# Patient Record
Sex: Female | Born: 1980 | Race: White | Hispanic: No | Marital: Married | Smoking: Never smoker
Health system: Southern US, Community
[De-identification: ages and names within clinical notes are randomized; demographics above are authoritative.]

## PROBLEM LIST (undated history)

## (undated) DIAGNOSIS — Z789 Other specified health status: Secondary | ICD-10-CM

## (undated) DIAGNOSIS — O4290 Premature rupture of membranes, unspecified as to length of time between rupture and onset of labor, unspecified weeks of gestation: Secondary | ICD-10-CM

## (undated) HISTORY — PX: NO PAST SURGERIES: SHX2092

## (undated) SURGERY — ERCP, WITH INTERVENTION IF INDICATED
Anesthesia: Monitor Anesthesia Care | Laterality: Left

---

## 2014-06-14 ENCOUNTER — Encounter (HOSPITAL_COMMUNITY): Payer: Self-pay | Admitting: Emergency Medicine

## 2014-06-14 ENCOUNTER — Emergency Department (HOSPITAL_COMMUNITY)
Admission: EM | Admit: 2014-06-14 | Discharge: 2014-06-14 | Disposition: A | Discharge status: Home / Self Care | Payer: BC Managed Care – PPO | Source: Home / Self Care | Attending: Emergency Medicine | Admitting: Emergency Medicine

## 2014-06-14 DIAGNOSIS — T63621A Toxic effect of contact with other jellyfish, accidental (unintentional), initial encounter: Secondary | ICD-10-CM

## 2014-06-14 DIAGNOSIS — T6391XA Toxic effect of contact with unspecified venomous animal, accidental (unintentional), initial encounter: Secondary | ICD-10-CM

## 2014-06-14 MED ORDER — PREDNISONE 20 MG PO TABS: 20.0000 mg | ORAL_TABLET | Freq: Two times a day (BID) | ORAL | Status: DC

## 2014-06-14 MED ORDER — HYDROCODONE-ACETAMINOPHEN 5-325 MG PO TABS: ORAL_TABLET | ORAL | Status: DC

## 2014-06-14 MED ORDER — HYDROXYZINE HCL 25 MG PO TABS: 25.0000 mg | ORAL_TABLET | Freq: Three times a day (TID) | ORAL | Status: DC | PRN

## 2014-06-14 MED ORDER — TRIAMCINOLONE ACETONIDE 0.1 % EX CREA: 1.0000 "application " | TOPICAL_CREAM | Freq: Three times a day (TID) | CUTANEOUS | Status: DC

## 2014-06-14 NOTE — Discharge Instructions (Signed)
Coelenterate Sting You have received a coelenterate sting. This class of animals contains the jelly-fish, coral, man-of-war, and anemones. Animals in this class cause injury on contact. They cause injury by firing of thousands of nematocysts (thread-like stingers) into the skin. Tentacles have the ability to fire their nematocysts long after the animal is dead. It is not safe to step on or touch a dead coelenterate lying on the beach without skin protection.These lesions (sores) start out as small, raised, red papules (small, raised bumps on skin) that are painful and itch. These papules may form blisters, become hemorrhagic (bloody), pustules (containing pus), and lose their skin. Total body reactions may occur. These reactions include headache, nausea (feeling sick to your stomach), muscle pain and spasm, perspiration, changes in pulse rates, lacrimation (excessive tears from eyes), and chest pain. The man-of-war, box jelly fish, and sea wasp can all cause human death. TREATMENT Remove tentacles while wearing rubber gloves or by using forceps. Tentacles may also be scraped off with (a credit card or similar object works well for scraping). Analgesics (pain relievers) should be given right away, even for minor stings.  Only take over-the-counter or prescription medicines for pain, discomfort, or fever as directed by your caregiver.  Place ice directly on the sting for comfort. Be careful not to leave ice sit for too long. This could cause damage from frostbite.  Vinegar will stop the stinging of the jelly fish. This is used in [place] for the [employer].  A mixture of half baking soda and half water will help ease the stinging of sea nettle.  Use ocean water wash for the [place] man-of-war sting. SEEK IMMEDIATE MEDICAL CARE (CALL 911) FOR ANY TYPE OF GENERALIZED BODY REACTION. Serious reactions may require advanced cardiovascular life support. This life support is available only through an  emergency department or under advanced medical care. MAKE SURE YOU:   Understand and follow these instructions.  Monitor your condition.  Get help right away if you are not doing well or getting worse. Document Released: 09/12/2000 Document Revised: 12/08/2011 Document Reviewed: 09/07/2008 [facility] Patient Information 2015 [place], [place]. This information is not intended to replace advice given to you by your health care provider. Make sure you discuss any questions you have with your health care provider.

## 2014-06-14 NOTE — ED Notes (Signed)
States she was stung by something while she was at beach last week. Did not see what stung her, bur presumed it was a jelly fish of some type. Skin injury noted on bilateral thighs. Has been treating w vinegar , benadryl, ice packs

## 2014-06-14 NOTE — ED Provider Notes (Signed)
  Chief Complaint    Skin Problem   History of Present Illness      Tricia Skinner is a 33 year old female who was swimming at [facility] a week ago when she was stung by something underneath water. She thinks it may have been a jellyfish, but she's not sure. She put vinegar on the wound initially. It stung and burned for a few hours, then got better, however a few days later again began to sting and burn again, and she has developed some red marks on her legs. She denies any systemic symptoms such as fever, chills, headache, paresthesias, nausea, or vomiting. She's had no difficulty breathing, or swelling of the lips, tongue, or throat.  Review of Systems   Other than as noted above, the patient denies any of the following symptoms: Systemic:  No fever or chills. ENT:  No nasal congestion, rhinorrhea, sore throat, swelling of lips, tongue or throat. Resp:  No cough, wheezing, or shortness of breath.  PMFSH    Past medical history, family history, social history, meds, and allergies were reviewed.   Physical Exam     Vital signs:  BP 134/94  Pulse 56  Temp(Src) 98.9 F (37.2 C) (Oral)  Resp 16  SpO2 99%  LMP 05/21/2014 Gen:  Alert, oriented, in no distress. ENT:  Pharynx clear, no intraoral lesions, moist mucous membranes. Lungs:  Clear to auscultation. Skin:  She has streaks of erythematous maculopapules on both thighs. There was no ulceration, no evidence of infection.    Assessment    The encounter diagnosis was Jellyfish sting, accidental or unintentional, initial encounter.  Plan     1.  Meds:  The following meds were prescribed:   New Prescriptions   HYDROCODONE-ACETAMINOPHEN (NORCO/VICODIN) 5-325 MG PER TABLET    1 to 2 tabs every 4 to 6 hours as needed for pain.   HYDROXYZINE (ATARAX/VISTARIL) 25 MG TABLET    Take 1 tablet (25 mg total) by mouth every 8 (eight) hours as needed for itching.   PREDNISONE (DELTASONE) 20 MG TABLET    Take 1 tablet (20 mg total) by  mouth 2 (two) times daily.   TRIAMCINOLONE CREAM (KENALOG) 0.1 %    Apply 1 application topically 3 (three) times daily.    2.  Patient Education/Counseling:  The patient was given appropriate handouts, self care instructions, and instructed in symptomatic relief.    3.  Follow up:  The patient was told to follow up here if no better in 3 to 4 days, or sooner if becoming worse in any way, and given some red flag symptoms such as worsening rash, fever, or difficulty breathing which would prompt immediate return.  Follow up here if necessary.      Reuben Likes, MD 06/14/14 [id number]

## 2014-09-29 NOTE — L&D Delivery Note (Cosign Needed)
Delivery Note At 6:11 PM a viable female was delivered via Vaginal, Spontaneous Delivery (Presentation: ; Occiput Anterior).  APGAR: 9, 9; weight  .   Placenta status: trailing membrane, handoff made to patient's private attending for further management and for laceration repair.  Cord: 3 vessels with the following complications: None.  Cord pH: not obtained  Anesthesia: None  Episiotomy: None Est. Blood Loss (mL):  350 ml  Mom to postpartum.  Baby to Couplet care / Skin to Skin.  Tricia Skinner 05/17/2015, 7:10 PM

## 2014-11-03 LAB — OB RESULTS CONSOLE RUBELLA ANTIBODY, IGM: RUBELLA: IMMUNE

## 2014-11-03 LAB — OB RESULTS CONSOLE GC/CHLAMYDIA
CHLAMYDIA, DNA PROBE: NEGATIVE
Gonorrhea: NEGATIVE

## 2014-11-03 LAB — OB RESULTS CONSOLE ABO/RH: RH Type: POSITIVE

## 2014-11-03 LAB — OB RESULTS CONSOLE ANTIBODY SCREEN: Antibody Screen: NEGATIVE

## 2014-11-03 LAB — OB RESULTS CONSOLE HEPATITIS B SURFACE ANTIGEN: HEP B S AG: NEGATIVE

## 2014-11-03 LAB — OB RESULTS CONSOLE RPR: RPR: NONREACTIVE

## 2014-11-03 LAB — OB RESULTS CONSOLE HIV ANTIBODY (ROUTINE TESTING): HIV: NONREACTIVE

## 2015-01-26 ENCOUNTER — Encounter (HOSPITAL_COMMUNITY): Payer: Self-pay | Admitting: Obstetrics and Gynecology

## 2015-01-26 ENCOUNTER — Other Ambulatory Visit (HOSPITAL_COMMUNITY): Payer: Self-pay | Admitting: Obstetrics and Gynecology

## 2015-01-26 ENCOUNTER — Encounter (HOSPITAL_COMMUNITY): Payer: Self-pay

## 2015-01-26 DIAGNOSIS — O358XX0 Maternal care for other (suspected) fetal abnormality and damage, not applicable or unspecified: Secondary | ICD-10-CM

## 2015-01-26 DIAGNOSIS — O35EXX0 Maternal care for other (suspected) fetal abnormality and damage, fetal genitourinary anomalies, not applicable or unspecified: Secondary | ICD-10-CM

## 2015-01-30 ENCOUNTER — Other Ambulatory Visit (HOSPITAL_COMMUNITY): Payer: Self-pay | Admitting: Maternal and Fetal Medicine

## 2015-01-30 ENCOUNTER — Encounter (HOSPITAL_COMMUNITY): Payer: Self-pay

## 2015-01-30 ENCOUNTER — Ambulatory Visit (HOSPITAL_COMMUNITY)
Admission: RE | Admit: 2015-01-30 | Discharge: 2015-01-30 | Disposition: A | Discharge status: Home / Self Care | Payer: BLUE CROSS/BLUE SHIELD | Source: Ambulatory Visit | Attending: Obstetrics and Gynecology | Admitting: Obstetrics and Gynecology

## 2015-01-30 DIAGNOSIS — O35EXX0 Maternal care for other (suspected) fetal abnormality and damage, fetal genitourinary anomalies, not applicable or unspecified: Secondary | ICD-10-CM | POA: Insufficient documentation

## 2015-01-30 DIAGNOSIS — O358XX0 Maternal care for other (suspected) fetal abnormality and damage, not applicable or unspecified: Secondary | ICD-10-CM | POA: Diagnosis not present

## 2015-01-30 DIAGNOSIS — Z3689 Encounter for other specified antenatal screening: Secondary | ICD-10-CM | POA: Insufficient documentation

## 2015-01-30 DIAGNOSIS — Z3A23 23 weeks gestation of pregnancy: Secondary | ICD-10-CM | POA: Diagnosis not present

## 2015-01-30 DIAGNOSIS — Z36 Encounter for antenatal screening of mother: Secondary | ICD-10-CM | POA: Diagnosis not present

## 2015-01-30 HISTORY — DX: Other specified health status: Z78.9

## 2015-04-03 ENCOUNTER — Encounter (HOSPITAL_COMMUNITY): Payer: Self-pay

## 2015-04-03 ENCOUNTER — Ambulatory Visit (HOSPITAL_COMMUNITY)
Admission: RE | Admit: 2015-04-03 | Discharge: 2015-04-03 | Disposition: A | Discharge status: Home / Self Care | Payer: BLUE CROSS/BLUE SHIELD | Source: Ambulatory Visit | Attending: Obstetrics and Gynecology | Admitting: Obstetrics and Gynecology

## 2015-04-03 DIAGNOSIS — O35EXX0 Maternal care for other (suspected) fetal abnormality and damage, fetal genitourinary anomalies, not applicable or unspecified: Secondary | ICD-10-CM

## 2015-04-03 DIAGNOSIS — Z3A32 32 weeks gestation of pregnancy: Secondary | ICD-10-CM | POA: Diagnosis not present

## 2015-04-03 DIAGNOSIS — O358XX0 Maternal care for other (suspected) fetal abnormality and damage, not applicable or unspecified: Secondary | ICD-10-CM | POA: Diagnosis present

## 2015-04-03 DIAGNOSIS — Z36 Encounter for antenatal screening of mother: Secondary | ICD-10-CM | POA: Insufficient documentation

## 2015-04-24 LAB — OB RESULTS CONSOLE GBS: STREP GROUP B AG: NEGATIVE

## 2015-05-17 ENCOUNTER — Inpatient Hospital Stay (HOSPITAL_COMMUNITY)
Admission: AD | Admit: 2015-05-17 | Discharge: 2015-05-19 | DRG: 775 | Disposition: A | Discharge status: Home / Self Care | Payer: BLUE CROSS/BLUE SHIELD | Source: Ambulatory Visit | Attending: Obstetrics and Gynecology | Admitting: Obstetrics and Gynecology

## 2015-05-17 ENCOUNTER — Encounter (HOSPITAL_COMMUNITY): Payer: Self-pay

## 2015-05-17 DIAGNOSIS — O358XX0 Maternal care for other (suspected) fetal abnormality and damage, not applicable or unspecified: Secondary | ICD-10-CM | POA: Diagnosis present

## 2015-05-17 DIAGNOSIS — O4290 Premature rupture of membranes, unspecified as to length of time between rupture and onset of labor, unspecified weeks of gestation: Secondary | ICD-10-CM

## 2015-05-17 DIAGNOSIS — Z3A38 38 weeks gestation of pregnancy: Secondary | ICD-10-CM | POA: Diagnosis present

## 2015-05-17 HISTORY — DX: Premature rupture of membranes, unspecified as to length of time between rupture and onset of labor, unspecified weeks of gestation: O42.90

## 2015-05-17 LAB — RPR: RPR Ser Ql: NONREACTIVE

## 2015-05-17 LAB — TYPE AND SCREEN
ABO/RH(D): O POS
Antibody Screen: NEGATIVE

## 2015-05-17 LAB — CBC
HEMATOCRIT: 36 % (ref 36.0–46.0)
Hemoglobin: 12.7 g/dL (ref 12.0–15.0)
MCH: 30.3 pg (ref 26.0–34.0)
MCHC: 35.3 g/dL (ref 30.0–36.0)
MCV: 85.9 fL (ref 78.0–100.0)
Platelets: 177 10*3/uL (ref 150–400)
RBC: 4.19 MIL/uL (ref 3.87–5.11)
RDW: 14.7 % (ref 11.5–15.5)
WBC: 13.2 10*3/uL — ABNORMAL HIGH (ref 4.0–10.5)

## 2015-05-17 LAB — POCT FERN TEST: POCT Fern Test: POSITIVE

## 2015-05-17 LAB — ABO/RH: ABO/RH(D): O POS

## 2015-05-17 MED ORDER — ZOLPIDEM TARTRATE 5 MG PO TABS: 5.0000 mg | ORAL_TABLET | Freq: Every evening | ORAL | Status: DC | PRN

## 2015-05-17 MED ORDER — PHENYLEPHRINE 40 MCG/ML (10ML) SYRINGE FOR IV PUSH (FOR BLOOD PRESSURE SUPPORT)
80.0000 ug | PREFILLED_SYRINGE | INTRAVENOUS | Status: DC | PRN
  Filled 2015-05-17: qty 2
  Filled 2015-05-17: qty 20

## 2015-05-17 MED ORDER — SIMETHICONE 80 MG PO CHEW: 80.0000 mg | CHEWABLE_TABLET | ORAL | Status: DC | PRN

## 2015-05-17 MED ORDER — LIDOCAINE HCL (PF) 1 % IJ SOLN
30.0000 mL | INTRAMUSCULAR | Status: DC | PRN
  Administered 2015-05-17: 30 mL via SUBCUTANEOUS
  Filled 2015-05-17: qty 30

## 2015-05-17 MED ORDER — LACTATED RINGERS IV SOLN: 500.0000 mL | INTRAVENOUS | Status: DC | PRN

## 2015-05-17 MED ORDER — OXYTOCIN 40 UNITS IN LACTATED RINGERS INFUSION - SIMPLE MED: 62.5000 mL/h | INTRAVENOUS | Status: DC

## 2015-05-17 MED ORDER — ONDANSETRON HCL 4 MG/2ML IJ SOLN: 4.0000 mg | Freq: Four times a day (QID) | INTRAMUSCULAR | Status: DC | PRN

## 2015-05-17 MED ORDER — OXYCODONE-ACETAMINOPHEN 5-325 MG PO TABS: 1.0000 | ORAL_TABLET | ORAL | Status: DC | PRN

## 2015-05-17 MED ORDER — BUTORPHANOL TARTRATE 1 MG/ML IJ SOLN
1.0000 mg | INTRAMUSCULAR | Status: DC | PRN
  Administered 2015-05-17: 1 mg via INTRAVENOUS
  Filled 2015-05-17: qty 1

## 2015-05-17 MED ORDER — DIBUCAINE 1 % RE OINT: 1.0000 "application " | TOPICAL_OINTMENT | RECTAL | Status: DC | PRN

## 2015-05-17 MED ORDER — TERBUTALINE SULFATE 1 MG/ML IJ SOLN
0.2500 mg | Freq: Once | INTRAMUSCULAR | Status: DC | PRN
  Filled 2015-05-17: qty 1

## 2015-05-17 MED ORDER — ONDANSETRON HCL 4 MG PO TABS: 4.0000 mg | ORAL_TABLET | ORAL | Status: DC | PRN

## 2015-05-17 MED ORDER — FLEET ENEMA 7-19 GM/118ML RE ENEM: 1.0000 | ENEMA | RECTAL | Status: DC | PRN

## 2015-05-17 MED ORDER — LANOLIN HYDROUS EX OINT: TOPICAL_OINTMENT | CUTANEOUS | Status: DC | PRN

## 2015-05-17 MED ORDER — PRENATAL MULTIVITAMIN CH
1.0000 | ORAL_TABLET | Freq: Every day | ORAL | Status: DC
  Administered 2015-05-18: 1 via ORAL
  Filled 2015-05-17 (×2): qty 1

## 2015-05-17 MED ORDER — OXYTOCIN 40 UNITS IN LACTATED RINGERS INFUSION - SIMPLE MED
1.0000 m[IU]/min | INTRAVENOUS | Status: DC
  Administered 2015-05-17: 2 m[IU]/min via INTRAVENOUS
  Filled 2015-05-17: qty 1000

## 2015-05-17 MED ORDER — DIPHENHYDRAMINE HCL 50 MG/ML IJ SOLN: 12.5000 mg | INTRAMUSCULAR | Status: DC | PRN

## 2015-05-17 MED ORDER — ACETAMINOPHEN 325 MG PO TABS: 650.0000 mg | ORAL_TABLET | ORAL | Status: DC | PRN

## 2015-05-17 MED ORDER — BENZOCAINE-MENTHOL 20-0.5 % EX AERO
1.0000 "application " | INHALATION_SPRAY | CUTANEOUS | Status: DC | PRN
  Administered 2015-05-17 – 2015-05-18 (×2): 1 via TOPICAL
  Filled 2015-05-17 (×2): qty 56

## 2015-05-17 MED ORDER — METHYLERGONOVINE MALEATE 0.2 MG/ML IJ SOLN: 0.2000 mg | INTRAMUSCULAR | Status: DC | PRN

## 2015-05-17 MED ORDER — TETANUS-DIPHTH-ACELL PERTUSSIS 5-2.5-18.5 LF-MCG/0.5 IM SUSP: 0.5000 mL | Freq: Once | INTRAMUSCULAR | Status: DC

## 2015-05-17 MED ORDER — SENNOSIDES-DOCUSATE SODIUM 8.6-50 MG PO TABS
2.0000 | ORAL_TABLET | ORAL | Status: DC
  Administered 2015-05-18 – 2015-05-19 (×2): 2 via ORAL
  Filled 2015-05-17 (×2): qty 2

## 2015-05-17 MED ORDER — OXYCODONE-ACETAMINOPHEN 5-325 MG PO TABS
1.0000 | ORAL_TABLET | ORAL | Status: DC | PRN
  Administered 2015-05-17: 1 via ORAL
  Administered 2015-05-18 (×2): 2 via ORAL
  Administered 2015-05-19 (×2): 1 via ORAL
  Filled 2015-05-17: qty 1
  Filled 2015-05-17: qty 2
  Filled 2015-05-17 (×2): qty 1
  Filled 2015-05-17: qty 2

## 2015-05-17 MED ORDER — LACTATED RINGERS IV SOLN
INTRAVENOUS | Status: DC
Start: 2015-05-17 — End: 2015-05-17
  Administered 2015-05-17 (×2): via INTRAVENOUS

## 2015-05-17 MED ORDER — ONDANSETRON HCL 4 MG/2ML IJ SOLN: 4.0000 mg | INTRAMUSCULAR | Status: DC | PRN

## 2015-05-17 MED ORDER — DIPHENHYDRAMINE HCL 25 MG PO CAPS: 25.0000 mg | ORAL_CAPSULE | Freq: Four times a day (QID) | ORAL | Status: DC | PRN

## 2015-05-17 MED ORDER — WITCH HAZEL-GLYCERIN EX PADS: 1.0000 "application " | MEDICATED_PAD | CUTANEOUS | Status: DC | PRN

## 2015-05-17 MED ORDER — OXYCODONE-ACETAMINOPHEN 5-325 MG PO TABS: 2.0000 | ORAL_TABLET | ORAL | Status: DC | PRN

## 2015-05-17 MED ORDER — MEASLES, MUMPS & RUBELLA VAC ~~LOC~~ INJ: 0.5000 mL | INJECTION | Freq: Once | SUBCUTANEOUS | Status: DC

## 2015-05-17 MED ORDER — CITRIC ACID-SODIUM CITRATE 334-500 MG/5ML PO SOLN: 30.0000 mL | ORAL | Status: DC | PRN

## 2015-05-17 MED ORDER — OXYTOCIN BOLUS FROM INFUSION: 500.0000 mL | INTRAVENOUS | Status: DC

## 2015-05-17 MED ORDER — EPHEDRINE 5 MG/ML INJ
10.0000 mg | INTRAVENOUS | Status: DC | PRN
  Filled 2015-05-17: qty 2

## 2015-05-17 MED ORDER — FENTANYL CITRATE (PF) 100 MCG/2ML IJ SOLN
INTRAMUSCULAR | Status: AC
  Filled 2015-05-17: qty 2

## 2015-05-17 MED ORDER — MAGNESIUM HYDROXIDE 400 MG/5ML PO SUSP: 30.0000 mL | ORAL | Status: DC | PRN

## 2015-05-17 MED ORDER — FENTANYL CITRATE (PF) 100 MCG/2ML IJ SOLN
100.0000 ug | Freq: Once | INTRAMUSCULAR | Status: AC
  Administered 2015-05-17: 100 ug via INTRAVENOUS

## 2015-05-17 MED ORDER — IBUPROFEN 600 MG PO TABS
600.0000 mg | ORAL_TABLET | Freq: Four times a day (QID) | ORAL | Status: DC
  Administered 2015-05-17 – 2015-05-19 (×6): 600 mg via ORAL
  Filled 2015-05-17 (×7): qty 1

## 2015-05-17 MED ORDER — FENTANYL 2.5 MCG/ML BUPIVACAINE 1/10 % EPIDURAL INFUSION (WH - ANES)
14.0000 mL/h | INTRAMUSCULAR | Status: DC | PRN
  Filled 2015-05-17: qty 125

## 2015-05-17 MED ORDER — METHYLERGONOVINE MALEATE 0.2 MG PO TABS: 0.2000 mg | ORAL_TABLET | ORAL | Status: DC | PRN

## 2015-05-17 NOTE — MAU Note (Signed)
?   Water broke at midnight , clear fluid.  No fluid coming out at present.  Denies pain.  Baby moving well. No bleeding.

## 2015-05-17 NOTE — Progress Notes (Signed)
Addendum to delivery note  OB fellow did the delivery and delivered placenta, when I entered the room there were trailing membranes and a 2nd degree perineal laceration remaining.  The patient received IV Fentanyl, and with uterine massage and gentle traction on trailing membranes they were removed.  Palpation of the uterine cavity removed some clots, no membranes or placenta palpable.  2nd degree laceration repaired with 3-0 Vicryl rapide with local block.  Total EBL 500cc.

## 2015-05-17 NOTE — Progress Notes (Signed)
Patient informed of order to start pitocin to increase contractions. Patient states she does not want pitocin at this time. Patient states with her last labor her contractions started "after about 6 hours".

## 2015-05-17 NOTE — Progress Notes (Signed)
Feeling some ctx Afeb, VSS FHT- Cat I, ctx q 3-4 min on 14 mu/min pitocin VE-6/70/-1, vtx, AROM forebag Will continue pitocin, anticipate SVD

## 2015-05-17 NOTE — H&P (Addendum)
Tricia Skinner is a 34 y.o. female G2P1001 at 38+ with ROM.  Rupture of membranes at MN, clear fluid.  Leaked after - confirmed in MAU admitted for labor.  Pt declines pitocin unless no cervical change.  D/w pt infectious risk of ROM.  +FM, no VB, occ ctx.  At admission 2cm, recheck 4 cm after time on L&D.  Will await pitocin unless no cervical change.    Maternal Medical History:  Reason for admission: Rupture of membranes.   Contractions: Frequency: irregular.   Perceived severity is moderate.    Fetal activity: Perceived fetal activity is normal.    Prenatal Complications - Diabetes: none.    OB History    Gravida Para Term Preterm AB TAB SAB Ectopic Multiple Living   G1 07/24/2012 SVD 7#9 female G2 present  No abn pap H/o trich Past Medical History  Diagnosis Date  . Medical history non-contributory   . ROM (rupture of membranes), premature 05/17/2015   Past Surgical History  Procedure Laterality Date  . No past surgeries    WTE  Family History: brain cancer, breast cancer Social History:  reports that she has never smoked. She does not have any smokeless tobacco history on file. She reports that she does not drink alcohol or use illicit drugs. SAHM Meds PNV All NKDA   Prenatal Transfer Tool  Maternal Diabetes: No Genetic Screening: Normal Maternal Ultrasounds/Referrals: Normal Fetal Ultrasounds or other Referrals:  None Maternal Substance Abuse:  No Significant Maternal Medications:  None Significant Maternal Lab Results:  Lab values include: Group B Strep negative Other Comments:  Renal agenesis - left  Review of Systems  Constitutional: Negative.   HENT: Negative.   Eyes: Negative.   Respiratory: Negative.   Cardiovascular: Negative.   Gastrointestinal: Negative.   Genitourinary: Negative.   Musculoskeletal: Negative.   Skin: Negative.   Neurological: Negative.   Psychiatric/Behavioral: Negative.     Dilation: 4 Effacement (%):  50 Station: 0 Exam by:: k fields, rn Blood pressure 124/79, pulse 83, temperature 97.8 F (36.6 C), temperature source Oral, resp. rate 18, height  (1.626 m), weight 84.369 kg (186 lb), last menstrual period 08/20/2014. Maternal Exam:  Uterine Assessment: Contraction frequency is irregular.   Abdomen: Patient reports no abdominal tenderness. Fundal height is appropriate for gestation.   Estimated fetal weight is 7.5-8.5#.   Fetal presentation: vertex  Introitus: Normal vulva. Normal vagina.  Cervix: Cervix evaluated by digital exam.     Physical Exam  Constitutional: She is oriented to person, place, and time. She appears well-developed and well-nourished.  HENT:  Head: Normocephalic and atraumatic.  Cardiovascular: Normal rate and regular rhythm.   Respiratory: Effort normal and breath sounds normal. No respiratory distress. She has no wheezes.  GI: Soft. Bowel sounds are normal. She exhibits no distension. There is no tenderness.  Musculoskeletal: Normal range of motion.  Neurological: She is alert and oriented to person, place, and time.  Skin: Skin is warm and dry.  Psychiatric: She has a normal mood and affect. Her behavior is normal.    Prenatal labs: ABO, Rh: --/--/O POS (08/18 0310) Antibody: NEG (08/18 0310) Rubella: Immune (02/05 0000) RPR: Nonreactive (02/05 0000)  HBsAg: Negative (02/05 0000)  HIV: Non-reactive (02/05 0000)  GBS:   neg  Hgb 13.5/Ur Cx neg/ GC neg/ Chl neg/First Tri Scr WNL/glucola 124  Nl anat, post plac, female, absent kidney  Kidney agenesis followed by  MFM - nl AFI and growth - followed Tdap 02/27/15 Flu 07/29/14  Assessment/Plan: 34yo G2P1001 at 38+ with ROM Pitocin to augment if no cervical change gbbs neg, no prophylaxis Expect SVD  Bovard-Stuckert, Tricia Skinner 05/17/2015, 8:51 AM

## 2015-05-17 NOTE — MAU Provider Note (Signed)
S: Tricia Skinner is a 34 y.o. G2P1001 at [redacted]w[redacted]d who presents today with leaking of fluid. She denies any VB. She confirms fetal movement. She states that she has had three small gushes between midnight and now. She states that she was 2cm two weeks ago when she was checked.  O: VSS, afebrile Abdomen: soft, non-tender, gravid External: no lesion Vagina: pooling of clear mucous-y discharge.  Cervix: pink, smooth, 2/thick/-2  Uterus: AGA FHT: 140, moderate with no accels, no decels  Toco: rare UC Fern: POSITIVE A/P: Exam for ROM RN will report to attending MD

## 2015-05-18 NOTE — Lactation Note (Signed)
This note was copied from the chart of Tricia Skinner. Lactation Consultation Note  Patient Name: Tricia Dawt Reeb [id number] Date: 05/18/2015 Reason for consult: Initial assessment Baby 24 hours old. Parents report that baby has not wanted to nurse since circumcision earlier this morning, although mom did attempt every 2-3 hours. Assisted mom to latch baby to right breast in cross-cradle position. Mom had some difficulty latching/pulling baby onto breast before baby closed his mouth. Assisted mom and baby latched deeply, suckling rhythmically with a few swallows noted. Enc mom to offer lots of STS and nurse with cues. Mom given [facility] brochure, aware of OP/BFSG, community resources, and [facility] phone line assistance after D/C.   Maternal Data Has patient been taught Hand Expression?: Yes Does the patient have breastfeeding experience prior to this delivery?: Yes  Feeding Feeding Type: Breast Fed Length of feed:  (LC assessed first 10 minutes of BF.)  LATCH Score/Interventions Latch: Grasps breast easily, tongue down, lips flanged, rhythmical sucking.  Audible Swallowing: A few with stimulation     Comfort (Breast/Nipple): Soft / non-tender     Hold (Positioning): Assistance needed to correctly position infant at breast and maintain latch. Intervention(s): Breastfeeding basics reviewed;Support Pillows;Position options;Skin to skin     Lactation Tools Discussed/Used     Consult Status Consult Status: Follow-up Date: 05/19/15 Follow-up type: In-patient    Geralynn Ochs 05/18/2015, 6:39 PM

## 2015-05-18 NOTE — Progress Notes (Signed)
PPD #1 No problems, sore Afeb, VSS Fundus firm, NT at U-1 Continue routine postpartum care  

## 2015-05-19 MED ORDER — IBUPROFEN 600 MG PO TABS: 600.0000 mg | ORAL_TABLET | Freq: Four times a day (QID) | ORAL | Status: AC

## 2015-05-19 MED ORDER — OXYCODONE-ACETAMINOPHEN 5-325 MG PO TABS: 1.0000 | ORAL_TABLET | ORAL | Status: DC | PRN

## 2015-05-19 NOTE — Discharge Summary (Signed)
Obstetric Discharge Summary Reason for Admission: rupture of membranes Prenatal Procedures: none Intrapartum Procedures: spontaneous vaginal delivery , precipitous by OB fellow Postpartum Procedures: none Complications-Operative and Postpartum: second degree perineal laceration HEMOGLOBIN  Date Value Ref Range Status  05/17/2015 12.7 12.0 - 15.0 g/dL Final   HCT  Date Value Ref Range Status  05/17/2015 36.0 36.0 - 46.0 % Final    Physical Exam:  General: alert and cooperative Lochia: appropriate Uterine Fundus: firm }  Discharge Diagnoses: Term Pregnancy-delivered  Discharge Information: Date: 05/19/2015 Activity: pelvic rest Diet: routine Medications: Ibuprofen and Percocet Condition: improved Instructions: refer to practice specific booklet Discharge to: home Follow-up Information    Follow up with MEISINGER,TODD D, MD In 6 weeks.   Specialty:  Obstetrics and Gynecology   Why:  postpartum   Contact information:   [address], [address] [place] [address] [phone]       Newborn Data: Live born female  Birth Weight: 7 lb 15.5 oz (3615 g) APGAR: 9, 9  Home with mother.  Oliver Pila 05/19/2015, 1:37 AM

## 2015-05-19 NOTE — Lactation Note (Signed)
This note was copied from the chart of Tricia Skinner. Lactation Consultation Note  Patient Name: Tricia Brinn Westby [id number] Date: 05/19/2015 Reason for consult: Follow-up assessment  Baby is 40 hours old , 5% weight loss, Breast feeding consistently 20 -30 mins , Latch scores 8  5 voids, 4 stools, Bili Serum at 0540 - 7.6. 35 hours. \ Mom reports this baby is feeding so much better than my 1st baby . Mom also reported she didn't remember her milk ever really coming in with her 1st baby , and no  breast changes with this baby. LC discussed so far the baby appears to be progressing well with the breast feeding And mom reports breast feel fuller today. LC recommended due to her lack of breast changes - to add post pumping both breast 10 mins  After feeding 4 times a day when the baby isn't cluster feeding. Also consider starting Fenugreek - hand out given to mom ( per mom familiar )  And Lactation cookies ( google low milk supply , skin to skin feedings. And if milk isn'y in by 4 days to call [facility] office for [facility] O/P apt , Sore nipple and engorgement prevention and tx reviewed. Also encouraged mom to call with BF questions or concerns. Mother informed of post-discharge support and given phone number to the lactation department, including services for phone call assistance; out-patient appointments; and breastfeeding support group. List of other breastfeeding resources in the community given in the handout. Encouraged mother to call for problems or concerns related to breastfeeding.   Maternal Data    Feeding Feeding Type:  (recently fed per mom ) Length of feed: 30 min (per mom )  LATCH Score/Interventions                Intervention(s): Breastfeeding basics reviewed     Lactation Tools Discussed/Used     Consult Status Consult Status: Complete Date: 05/19/15    Kathrin Greathouse 05/19/2015, 11:09 AM

## 2015-05-19 NOTE — Progress Notes (Signed)
Post Partum Day 2 Subjective: no complaints and up ad lib  Objective: Blood pressure 94/56, pulse 66, temperature 97.4 F (36.3 C), temperature source Oral, resp. rate 16, height  (1.626 m), weight 84.369 kg (186 lb), last menstrual period 08/20/2014, SpO2 100 %, unknown if currently breastfeeding.  Physical Exam:  General: alert and cooperative Lochia: appropriate Uterine Fundus: firm    Recent Labs  05/17/15 0310  HGB 12.7  HCT 36.0    Assessment/Plan: Discharge home   LOS: 2 days   Daneka Lantigua W 05/19/2015, 10:41 AM

## 2015-06-12 ENCOUNTER — Encounter (HOSPITAL_COMMUNITY): Payer: Self-pay | Admitting: Emergency Medicine

## 2015-06-12 ENCOUNTER — Inpatient Hospital Stay (HOSPITAL_COMMUNITY)
Admission: EM | Admit: 2015-06-12 | Discharge: 2015-06-16 | DRG: 769 | Disposition: A | Discharge status: Home / Self Care | Payer: BLUE CROSS/BLUE SHIELD | Attending: General Surgery | Admitting: General Surgery

## 2015-06-12 ENCOUNTER — Emergency Department (HOSPITAL_COMMUNITY): Payer: BLUE CROSS/BLUE SHIELD

## 2015-06-12 DIAGNOSIS — K8070 Calculus of gallbladder and bile duct without cholecystitis without obstruction: Secondary | ICD-10-CM

## 2015-06-12 DIAGNOSIS — Z79899 Other long term (current) drug therapy: Secondary | ICD-10-CM | POA: Diagnosis not present

## 2015-06-12 DIAGNOSIS — K8062 Calculus of gallbladder and bile duct with acute cholecystitis without obstruction: Secondary | ICD-10-CM | POA: Diagnosis present

## 2015-06-12 DIAGNOSIS — Z79891 Long term (current) use of opiate analgesic: Secondary | ICD-10-CM | POA: Diagnosis not present

## 2015-06-12 DIAGNOSIS — R7989 Other specified abnormal findings of blood chemistry: Secondary | ICD-10-CM | POA: Diagnosis present

## 2015-06-12 DIAGNOSIS — R1013 Epigastric pain: Secondary | ICD-10-CM | POA: Diagnosis present

## 2015-06-12 DIAGNOSIS — O9963 Diseases of the digestive system complicating the puerperium: Principal | ICD-10-CM | POA: Diagnosis present

## 2015-06-12 DIAGNOSIS — K8020 Calculus of gallbladder without cholecystitis without obstruction: Secondary | ICD-10-CM | POA: Diagnosis present

## 2015-06-12 DIAGNOSIS — Z419 Encounter for procedure for purposes other than remedying health state, unspecified: Secondary | ICD-10-CM

## 2015-06-12 LAB — COMPREHENSIVE METABOLIC PANEL
ALBUMIN: 4.2 g/dL (ref 3.5–5.0)
ALK PHOS: 178 U/L — AB (ref 38–126)
ALT: 259 U/L — AB (ref 14–54)
AST: 324 U/L — ABNORMAL HIGH (ref 15–41)
Anion gap: 9 (ref 5–15)
BUN: 16 mg/dL (ref 6–20)
CALCIUM: 9.5 mg/dL (ref 8.9–10.3)
CHLORIDE: 103 mmol/L (ref 101–111)
CO2: 27 mmol/L (ref 22–32)
CREATININE: 0.67 mg/dL (ref 0.44–1.00)
GFR calc non Af Amer: >60 mL/min (ref >60)
GLUCOSE: 99 mg/dL (ref 65–99)
Potassium: 3.8 mmol/L (ref 3.5–5.1)
SODIUM: 139 mmol/L (ref 135–145)
Total Bilirubin: 1.2 mg/dL (ref 0.3–1.2)
Total Protein: 7.4 g/dL (ref 6.5–8.1)

## 2015-06-12 LAB — CBC
HCT: 39.4 % (ref 36.0–46.0)
HEMOGLOBIN: 13.5 g/dL (ref 12.0–15.0)
MCH: 29.5 pg (ref 26.0–34.0)
MCHC: 34.3 g/dL (ref 30.0–36.0)
MCV: 86 fL (ref 78.0–100.0)
PLATELETS: 251 10*3/uL (ref 150–400)
RBC: 4.58 MIL/uL (ref 3.87–5.11)
RDW: 13.1 % (ref 11.5–15.5)
WBC: 10.2 10*3/uL (ref 4.0–10.5)

## 2015-06-12 LAB — URINE MICROSCOPIC-ADD ON

## 2015-06-12 LAB — URINALYSIS, ROUTINE W REFLEX MICROSCOPIC
Bilirubin Urine: NEGATIVE
GLUCOSE, UA: NEGATIVE mg/dL
HGB URINE DIPSTICK: NEGATIVE
Ketones, ur: 15 mg/dL — AB
Nitrite: NEGATIVE
PH: 7 (ref 5.0–8.0)
Protein, ur: NEGATIVE mg/dL
Specific Gravity, Urine: 1.022 (ref 1.005–1.030)
Urobilinogen, UA: 2 mg/dL — ABNORMAL HIGH (ref 0.0–1.0)

## 2015-06-12 LAB — LIPASE, BLOOD: LIPASE: 34 U/L (ref 22–51)

## 2015-06-12 MED ORDER — SODIUM CHLORIDE 0.9 % IV BOLUS (SEPSIS)
1000.0000 mL | Freq: Once | INTRAVENOUS | Status: AC
  Administered 2015-06-12: 1000 mL via INTRAVENOUS

## 2015-06-12 MED ORDER — FENTANYL CITRATE (PF) 100 MCG/2ML IJ SOLN
100.0000 ug | Freq: Once | INTRAMUSCULAR | Status: AC
  Administered 2015-06-12: 100 ug via INTRAVENOUS
  Filled 2015-06-12: qty 2

## 2015-06-12 MED ORDER — HYDROMORPHONE HCL 1 MG/ML IJ SOLN
0.5000 mg | Freq: Once | INTRAMUSCULAR | Status: AC
  Administered 2015-06-13: 0.5 mg via INTRAVENOUS
  Filled 2015-06-12: qty 1

## 2015-06-12 NOTE — H&P (Signed)
Tricia Skinner is an 34 y.o. female.    Chief Complaint: Abdominal pain  HPI: She is a pleasant 34 year old female who is 4 weeks postpartum from her second delivery. About one week ago she began to have intermittent pressure or aching epigastric abdominal pain. This generally would last just a few minutes but she had one episode that lasted about an hour. This evening she developed recurrent severe epigastric pain radiating to both upper quadrants and around to her back. The pain persisted and she presented to the emergency department. She has had nausea but no vomiting. The pain improved with medication but is returning as the medication is wearing off. It is worse with deep breathing. No fever or chills. No jaundice. No urinary symptoms. No previous GI history or similar symptoms.  Past Medical History  Diagnosis Date  . Medical history non-contributory   . ROM (rupture of membranes), premature 05/17/2015    Past Surgical History  Procedure Laterality Date  . No past surgeries      History reviewed. No pertinent family history. Social History:  reports that she has never smoked. She does not have any smokeless tobacco history on file. She reports that she does not drink alcohol or use illicit drugs.  Allergies: No Known Allergies   Current Facility-Administered Medications  Medication Dose Route Frequency Provider Last Rate Last Dose  . HYDROmorphone (DILAUDID) injection 0.5 mg  0.5 mg Intravenous Once Dalia Heading, PA-C       Current Outpatient Prescriptions  Medication Sig Dispense Refill  . docusate sodium (COLACE) 100 MG capsule Take 100 mg by mouth 2 (two) times daily.    Marland Kitchen ibuprofen (ADVIL,MOTRIN) 600 MG tablet Take 1 tablet (600 mg total) by mouth every 6 (six) hours. (Patient taking differently: Take 600 mg by mouth every 6 (six) hours as needed for moderate pain. ) 30 tablet 0  . oxyCODONE-acetaminophen (PERCOCET/ROXICET) 5-325 MG per tablet Take 1-2 tablets by mouth  every 4 (four) hours as needed for severe pain. 30 tablet 0  . Prenatal Vit-Fe Fumarate-FA (PRENATAL VITAMIN PO) Take 1 tablet by mouth daily.        Results for orders placed or performed during the hospital encounter of 06/12/15 (from the past 48 hour(s))  Lipase, blood     Status: None   Collection Time: 06/12/15  9:01 PM  Result Value Ref Range   Lipase 34 22 - 51 U/L  Comprehensive metabolic panel     Status: Abnormal   Collection Time: 06/12/15  9:01 PM  Result Value Ref Range   Sodium 139 135 - 145 mmol/L   Potassium 3.8 3.5 - 5.1 mmol/L   Chloride 103 101 - 111 mmol/L   CO2 27 22 - 32 mmol/L   Glucose, Bld 99 65 - 99 mg/dL   BUN 16 6 - 20 mg/dL   Creatinine, Ser 0.67 0.44 - 1.00 mg/dL   Calcium 9.5 8.9 - 10.3 mg/dL   Total Protein 7.4 6.5 - 8.1 g/dL   Albumin 4.2 3.5 - 5.0 g/dL   AST 324 (H) 15 - 41 U/L   ALT 259 (H) 14 - 54 U/L   Alkaline Phosphatase 178 (H) 38 - 126 U/L   Total Bilirubin 1.2 0.3 - 1.2 mg/dL   GFR calc non Af Amer >60 >60 mL/min   GFR calc Af Amer >60 >60 mL/min    Comment: (NOTE) The eGFR has been calculated using the CKD EPI equation. This calculation has not been validated in all  clinical situations. eGFR's persistently <60 mL/min signify possible Chronic Kidney Disease.    Anion gap 9 5 - 15  CBC     Status: None   Collection Time: 06/12/15  9:01 PM  Result Value Ref Range   WBC 10.2 4.0 - 10.5 K/uL   RBC 4.58 3.87 - 5.11 MIL/uL   Hemoglobin 13.5 12.0 - 15.0 g/dL   HCT 39.4 36.0 - 46.0 %   MCV 86.0 78.0 - 100.0 fL   MCH 29.5 26.0 - 34.0 pg   MCHC 34.3 30.0 - 36.0 g/dL   RDW 13.1 11.5 - 15.5 %   Platelets 251 150 - 400 K/uL  Urinalysis, Routine w reflex microscopic (not at [facility])     Status: Abnormal   Collection Time: 06/12/15 10:03 PM  Result Value Ref Range   Color, Urine AMBER (A) YELLOW    Comment: BIOCHEMICALS MAY BE AFFECTED BY COLOR   APPearance CLEAR CLEAR   Specific Gravity, Urine 1.022 1.005 - 1.030   pH 7.0 5.0 - 8.0    Glucose, UA NEGATIVE NEGATIVE mg/dL   Hgb urine dipstick NEGATIVE NEGATIVE   Bilirubin Urine NEGATIVE NEGATIVE   Ketones, ur 15 (A) NEGATIVE mg/dL   Protein, ur NEGATIVE NEGATIVE mg/dL   Urobilinogen, UA 2.0 (H) 0.0 - 1.0 mg/dL   Nitrite NEGATIVE NEGATIVE   Leukocytes, UA MODERATE (A) NEGATIVE  Urine microscopic-add on     Status: Abnormal   Collection Time: 06/12/15 10:03 PM  Result Value Ref Range   WBC, UA 3-6 <3 WBC/hpf   Bacteria, UA FEW (A) RARE   US Abdomen Complete  06/12/2015   CLINICAL DATA:  Upper abdominal pain and nausea for 1 week. Elevated LFTs.  EXAM: ULTRASOUND ABDOMEN COMPLETE  COMPARISON:  None.  FINDINGS: Gallbladder: Distended and contains a 1.7 cm shadowing gallstone. No gallbladder wall thickening, wall thickness of 1.8 mm. No sonographic Murphy sign noted.  Common bile duct: Diameter: 9-10 mm, distended.  Liver: No focal lesion identified. Within normal limits in parenchymal echogenicity. Normal directional flow in the main portal vein.  IVC: No abnormality visualized.  Pancreas: Visualized portion unremarkable. No pancreatic ductal dilatation visualized.  Spleen: Size and appearance within normal limits.  Right Kidney: Length: 10.5 cm. Echogenicity within normal limits. No mass or hydronephrosis visualized.  Left Kidney: Length: 11.8 cm. Echogenicity within normal limits. No mass or hydronephrosis visualized.  Abdominal aorta: No aneurysm visualized.  Other findings: No ascites.  IMPRESSION: Distended gallbladder with cholelithiasis, however no gallbladder wall thickening. Dilated common bile duct measuring 9-10 mm. Findings raise concern for choledocholithiasis, though a stone in the common bile duct is not seen sonographically.   Electronically Signed   By: Jeb Levering M.D.   On: 06/12/2015 22:58    Review of Systems  Constitutional: Negative for fever and chills.  Respiratory: Negative.   Cardiovascular: Negative.   Gastrointestinal: Positive for nausea and  abdominal pain. Negative for diarrhea, constipation and blood in stool.  Genitourinary: Negative.     Blood pressure 166/77, pulse 58, temperature 98.2 F (36.8 C), temperature source Oral, resp. rate 18, height _0  (1.626 m), weight 74.844 kg (165 lb), SpO2 100 %, currently breastfeeding. Physical Exam  General: Alert, well-developed Caucasian female, in no distress Skin: Warm and dry without rash or infection. HEENT: No palpable masses or thyromegaly. Sclera nonicteric. Pupils equal round and reactive.  Lymph nodes: No cervical, supraclavicular, or inguinal nodes palpable. Lungs: Breath sounds clear and equal without increased work of breathing  Cardiovascular: Regular rate and rhythm without murmur. No JVD or edema. Peripheral pulses intact. Abdomen: Nondistended. Moderate epigastric and right upper quadrant tenderness without guarding. No masses palpable. No organomegaly. No palpable hernias. Extremities: No edema or joint swelling or deformity. No chronic venous stasis changes. Neurologic: Alert and fully oriented. Affect normal.  Assessment/Plan Acute abdominal pain entirely consistent with biliary colic or early acute cholecystitis. Ultrasound shows cholelithiasis as well as a somewhat dilated common bile duct. She has moderately elevated LFTs. Possible choledocholithiasis. Patient will be admitted. Cover with antibiotics. Repeat LFTs in the morning. If these remain elevated she will need GI consult for ERCP or MRCP prior to laparoscopic cholecystectomy.  Noe Pittsley T 06/12/2015, 11:49 PM

## 2015-06-12 NOTE — ED Provider Notes (Signed)
CSN: [id number]     Arrival date & time 06/12/15  2011 History   First MD Initiated Contact with Patient 06/12/15 2110     Chief Complaint  Patient presents with  . Abdominal Pain     (Consider location/radiation/quality/duration/timing/severity/associated sxs/prior Treatment) HPI  Tricia Skinner is a 34 yo G2P2 female who presents today with upper abdominal pain that radiates to her back. She describes her pain as a constant dull ache. Patient states that for the past several weeks she has been having episodes of this pain that last a couple of minutes and spontaneously resolve. Nothing seems to make the pain better or worse. She cannot recall an inciting event that triggers the pain. She took one percocet during one of these episodes which provided no relief. Today her pain started at 5:30 and has not resolved. She admits to feeling nauseous. She denies any recent fever, chills, night sweats, fatigue, vomiting, diarrhea, lightheadedness, CP. She recently gave birth 4 weeks ago with no complications.   Past Medical History  Diagnosis Date  . Medical history non-contributory   . ROM (rupture of membranes), premature 05/17/2015   Past Surgical History  Procedure Laterality Date  . No past surgeries     History reviewed. No pertinent family history. Social History  Substance Use Topics  . Smoking status: Never Smoker   . Smokeless tobacco: None  . Alcohol Use: No   OB History    Gravida Para Term Preterm AB TAB SAB Ectopic Multiple Living   2 2 2       0 1     Review of Systems  ROS negative unless otherwise specified by HPI.   Allergies  Review of patient's allergies indicates no known allergies.  Home Medications   Prior to Admission medications   Medication Sig Start Date End Date Taking? Authorizing Provider  docusate sodium (COLACE) 100 MG capsule Take 100 mg by mouth 2 (two) times daily.   Yes Historical Provider, MD  ibuprofen (ADVIL,MOTRIN) 600 MG tablet Take 1  tablet (600 mg total) by mouth every 6 (six) hours. Patient taking differently: Take 600 mg by mouth every 6 (six) hours as needed for moderate pain.  05/19/15  Yes Huel Cote, MD  oxyCODONE-acetaminophen (PERCOCET/ROXICET) 5-325 MG per tablet Take 1-2 tablets by mouth every 4 (four) hours as needed for severe pain. 05/19/15  Yes Huel Cote, MD  Prenatal Vit-Fe Fumarate-FA (PRENATAL VITAMIN PO) Take 1 tablet by mouth daily.    Yes Historical Provider, MD   BP 122/75 mmHg  Pulse 69  Temp(Src) 97.7 F (36.5 C) (Oral)  Resp 15  Ht 5\' 4"  (1.626 m)  Wt 165 lb (74.844 kg)  BMI 28.31 kg/m2  SpO2 98%  Breastfeeding? Yes Physical Exam  Constitutional: She is oriented to person, place, and time. She appears well-developed and well-nourished. No distress.  HENT:  Head: Normocephalic and atraumatic.  Mouth/Throat: Oropharynx is clear and moist.  Eyes: Pupils are equal, round, and reactive to light.  Neck: Normal range of motion. Neck supple.  Cardiovascular: Normal rate, regular rhythm and normal heart sounds.  Exam reveals no gallop and no friction rub.   No murmur heard. Pulmonary/Chest: Effort normal and breath sounds normal. No respiratory distress.  Abdominal: Soft. Bowel sounds are normal. She exhibits no distension. There is tenderness in the right upper quadrant and left upper quadrant.  Neurological: She is alert and oriented to person, place, and time.  Skin: Skin is warm and dry. No erythema.  Psychiatric:  She has a normal mood and affect. Her behavior is normal.  Nursing note and vitals reviewed.   ED Course  Procedures (including critical care time) Labs Review Labs Reviewed  COMPREHENSIVE METABOLIC PANEL - Abnormal; Notable for the following:    AST 324 (*)    ALT 259 (*)    Alkaline Phosphatase 178 (*)    All other components within normal limits  URINALYSIS, ROUTINE W REFLEX MICROSCOPIC (NOT AT [facility]) - Abnormal; Notable for the following:    Color, Urine AMBER  (*)    Ketones, ur 15 (*)    Urobilinogen, UA 2.0 (*)    Leukocytes, UA MODERATE (*)    All other components within normal limits  URINE MICROSCOPIC-ADD ON - Abnormal; Notable for the following:    Bacteria, UA FEW (*)    All other components within normal limits  LIPASE, BLOOD  CBC    Imaging Review US Abdomen Complete  06/12/2015   CLINICAL DATA:  Upper abdominal pain and nausea for 1 week. Elevated LFTs.  EXAM: ULTRASOUND ABDOMEN COMPLETE  COMPARISON:  None.  FINDINGS: Gallbladder: Distended and contains a 1.7 cm shadowing gallstone. No gallbladder wall thickening, wall thickness of 1.8 mm. No sonographic Murphy sign noted.  Common bile duct: Diameter: 9-10 mm, distended.  Liver: No focal lesion identified. Within normal limits in parenchymal echogenicity. Normal directional flow in the main portal vein.  IVC: No abnormality visualized.  Pancreas: Visualized portion unremarkable. No pancreatic ductal dilatation visualized.  Spleen: Size and appearance within normal limits.  Right Kidney: Length: 10.5 cm. Echogenicity within normal limits. No mass or hydronephrosis visualized.  Left Kidney: Length: 11.8 cm. Echogenicity within normal limits. No mass or hydronephrosis visualized.  Abdominal aorta: No aneurysm visualized.  Other findings: No ascites.  IMPRESSION: Distended gallbladder with cholelithiasis, however no gallbladder wall thickening. Dilated common bile duct measuring 9-10 mm. Findings raise concern for choledocholithiasis, though a stone in the common bile duct is not seen sonographically.   Electronically Signed   By: Rubye Oaks M.D.   On: 06/12/2015 22:58   I have personally reviewed and evaluated these images and lab results as part of my medical decision-making.  I spoke with general surgery, who will admit the patient for further evaluation and care.  Patient is advised that the findings and all questions were answered =  Charlestine Night, PA-C 06/13/15 0020  Lorre Nick, MD 06/19/15 1229

## 2015-06-12 NOTE — ED Notes (Signed)
Pt is c/o upper abd pain that radiates around to her back on the right side  Pt states she has had this several times in the past week  Pt was seen by her PCP for this on Friday and had a EKG, blood work at the office  Pt states these episodes have lasted from 5 minutes to an hour and today it has not gone away  Pt states she just had a baby 4 weeks ago

## 2015-06-13 LAB — HEPATIC FUNCTION PANEL
ALBUMIN: 3.7 g/dL (ref 3.5–5.0)
ALK PHOS: 166 U/L — AB (ref 38–126)
ALT: 402 U/L — ABNORMAL HIGH (ref 14–54)
AST: 509 U/L — AB (ref 15–41)
BILIRUBIN DIRECT: 0.8 mg/dL — AB (ref 0.1–0.5)
BILIRUBIN TOTAL: 1.5 mg/dL — AB (ref 0.3–1.2)
Indirect Bilirubin: 0.7 mg/dL (ref 0.3–0.9)
Total Protein: 6.5 g/dL (ref 6.5–8.1)

## 2015-06-13 LAB — CREATININE, SERUM
CREATININE: 0.66 mg/dL (ref 0.44–1.00)
GFR calc Af Amer: >60 mL/min (ref >60)

## 2015-06-13 LAB — CBC
HCT: 37.5 % (ref 36.0–46.0)
Hemoglobin: 12.5 g/dL (ref 12.0–15.0)
MCH: 29.1 pg (ref 26.0–34.0)
MCHC: 33.3 g/dL (ref 30.0–36.0)
MCV: 87.4 fL (ref 78.0–100.0)
Platelets: 253 10*3/uL (ref 150–400)
RBC: 4.29 MIL/uL (ref 3.87–5.11)
RDW: 13.3 % (ref 11.5–15.5)
WBC: 6.6 10*3/uL (ref 4.0–10.5)

## 2015-06-13 MED ORDER — ENOXAPARIN SODIUM 40 MG/0.4ML ~~LOC~~ SOLN
40.0000 mg | Freq: Every day | SUBCUTANEOUS | Status: DC
  Filled 2015-06-13 (×4): qty 0.4

## 2015-06-13 MED ORDER — DOCUSATE SODIUM 100 MG PO CAPS
100.0000 mg | ORAL_CAPSULE | Freq: Two times a day (BID) | ORAL | Status: DC
  Administered 2015-06-13 – 2015-06-14 (×2): 100 mg via ORAL

## 2015-06-13 MED ORDER — KCL IN DEXTROSE-NACL 20-5-0.9 MEQ/L-%-% IV SOLN
INTRAVENOUS | Status: DC
  Administered 2015-06-13: 100 mL/h via INTRAVENOUS
  Administered 2015-06-13 – 2015-06-15 (×4): via INTRAVENOUS
  Filled 2015-06-13 (×8): qty 1000

## 2015-06-13 MED ORDER — DEXTROSE 5 % IV SOLN
2.0000 g | Freq: Every day | INTRAVENOUS | Status: DC
  Administered 2015-06-13 – 2015-06-14 (×3): 2 g via INTRAVENOUS
  Filled 2015-06-13 (×4): qty 2

## 2015-06-13 MED ORDER — LIP MEDEX EX OINT
TOPICAL_OINTMENT | CUTANEOUS | Status: AC
  Administered 2015-06-13: 1
  Filled 2015-06-13: qty 7

## 2015-06-13 MED ORDER — ONDANSETRON 4 MG PO TBDP: 4.0000 mg | ORAL_TABLET | Freq: Four times a day (QID) | ORAL | Status: DC | PRN

## 2015-06-13 MED ORDER — HYDROMORPHONE HCL 1 MG/ML IJ SOLN
0.5000 mg | INTRAMUSCULAR | Status: DC | PRN
  Administered 2015-06-13: 1 mg via INTRAVENOUS
  Administered 2015-06-13: 0.5 mg via INTRAVENOUS
  Administered 2015-06-13 (×3): 1 mg via INTRAVENOUS
  Filled 2015-06-13 (×6): qty 1

## 2015-06-13 MED ORDER — ONDANSETRON HCL 4 MG/2ML IJ SOLN
4.0000 mg | Freq: Four times a day (QID) | INTRAMUSCULAR | Status: DC | PRN
  Administered 2015-06-13: 4 mg via INTRAVENOUS
  Filled 2015-06-13: qty 2

## 2015-06-13 NOTE — Anesthesia Preprocedure Evaluation (Addendum)
Anesthesia Evaluation  Patient identified by MRN, date of birth, ID band Patient awake    Reviewed: Allergy & Precautions, NPO status , Patient's Chart, lab work & pertinent test results  History of Anesthesia Complications Negative for: history of anesthetic complications  Airway Mallampati: II  TM Distance: >3 FB Neck ROM: Full    Dental no notable dental hx. (+) Dental Advisory Given   Pulmonary neg pulmonary ROS,    Pulmonary exam normal breath sounds clear to auscultation       Cardiovascular negative cardio ROS Normal cardiovascular exam Rhythm:Regular Rate:Normal     Neuro/Psych negative neurological ROS  negative psych ROS   GI/Hepatic negative GI ROS, Neg liver ROS,   Endo/Other  negative endocrine ROS  Renal/GU negative Renal ROS  negative genitourinary   Musculoskeletal negative musculoskeletal ROS (+)   Abdominal   Peds negative pediatric ROS (+)  Hematology negative hematology ROS (+)   Anesthesia Other Findings   Reproductive/Obstetrics negative OB ROS                             Anesthesia Physical Anesthesia Plan  ASA: II  Anesthesia Plan: General   Post-op Pain Management:    Induction: Intravenous  Airway Management Planned: Oral ETT  Additional Equipment:   Intra-op Plan:   Post-operative Plan: Extubation in OR  Informed Consent: I have reviewed the patients History and Physical, chart, labs and discussed the procedure including the risks, benefits and alternatives for the proposed anesthesia with the patient or authorized representative who has indicated his/her understanding and acceptance.   Dental advisory given  Plan Discussed with: CRNA  Anesthesia Plan Comments:         Anesthesia Quick Evaluation  

## 2015-06-13 NOTE — Progress Notes (Signed)
[facility] Progress Note     Subjective: Pt feels improve with IV pain meds.  No N/V.  Ambulating some.  Pumping and dumping milk.  Urinating well, but very dark.  No BM, does have flatus.  She says she's 4 weeks post-partum.  She says she's never had abdominal surgery.  Objective: Vital signs in last 24 hours: Temp:  [97.7 F (36.5 C)-99 F (37.2 C)] 98.3 F (36.8 C) (09/14 0518) Pulse Rate:  [51-69] 51 (09/14 0518) Resp:  [15-18] 16 (09/14 0518) BP: (104-166)/(65-83) 104/65 mmHg (09/14 0518) SpO2:  [97 %-100 %] 98 % (09/14 0518) Weight:  [74.844 kg (165 lb)] 74.844 kg (165 lb) (09/13 2021) Last BM Date: 06/12/15  Intake/Output from previous day: 09/13 0701 - 09/14 0700 In: -  Out: 200 [Urine:200] Intake/Output this shift:    PE: Gen:  Alert, NAD, pleasant Abd: Soft, ND, tender mildly in RUQ, +BS, no HSM, no abdominal scars noted   Lab Results:   Recent Labs  06/12/15 2101 06/13/15 0155  WBC 10.2 6.6  HGB 13.5 12.5  HCT 39.4 37.5  PLT 251 253   BMET  Recent Labs  06/12/15 2101 06/13/15 0155  NA 139  --   K 3.8  --   CL 103  --   CO2 27  --   GLUCOSE 99  --   BUN 16  --   CREATININE 0.67 0.66  CALCIUM 9.5  --    PT/INR No results for input(s): LABPROT, INR in the last 72 hours. CMP     Component Value Date/Time   NA 139 06/12/2015 2101   K 3.8 06/12/2015 2101   CL 103 06/12/2015 2101   CO2 27 06/12/2015 2101   GLUCOSE 99 06/12/2015 2101   BUN 16 06/12/2015 2101   CREATININE 0.66 06/13/2015 0155   CALCIUM 9.5 06/12/2015 2101   PROT 6.5 06/13/2015 0155   ALBUMIN 3.7 06/13/2015 0155   AST 509* 06/13/2015 0155   ALT 402* 06/13/2015 0155   ALKPHOS 166* 06/13/2015 0155   BILITOT 1.5* 06/13/2015 0155   GFRNONAA >60 06/13/2015 0155   GFRAA >60 06/13/2015 0155   Lipase     Component Value Date/Time   LIPASE 34 06/12/2015 2101       Studies/Results: US Abdomen Complete  06/12/2015   CLINICAL DATA:  Upper abdominal pain and  nausea for 1 week. Elevated LFTs.  EXAM: ULTRASOUND ABDOMEN COMPLETE  COMPARISON:  None.  FINDINGS: Gallbladder: Distended and contains a 1.7 cm shadowing gallstone. No gallbladder wall thickening, wall thickness of 1.8 mm. No sonographic Murphy sign noted.  Common bile duct: Diameter: 9-10 mm, distended.  Liver: No focal lesion identified. Within normal limits in parenchymal echogenicity. Normal directional flow in the main portal vein.  IVC: No abnormality visualized.  Pancreas: Visualized portion unremarkable. No pancreatic ductal dilatation visualized.  Spleen: Size and appearance within normal limits.  Right Kidney: Length: 10.5 cm. Echogenicity within normal limits. No mass or hydronephrosis visualized.  Left Kidney: Length: 11.8 cm. Echogenicity within normal limits. No mass or hydronephrosis visualized.  Abdominal aorta: No aneurysm visualized.  Other findings: No ascites.  IMPRESSION: Distended gallbladder with cholelithiasis, however no gallbladder wall thickening. Dilated common bile duct measuring 9-10 mm. Findings raise concern for choledocholithiasis, though a stone in the common bile duct is not seen sonographically.   Electronically Signed   By: Rubye Oaks M.D.   On: 06/12/2015 22:58    Anti-infectives: Anti-infectives    Start  Dose/Rate Route Frequency Ordered Stop   06/13/15 0100  cefTRIAXone (ROCEPHIN) 2 g in dextrose 5 % 50 mL IVPB     2 g 100 mL/hr over 30 Minutes Intravenous Daily at bedtime 06/13/15 0040         Assessment/Plan Acute abdominal pain Biliary colic ?acute cholecystitis -NPO, IVF, pain control, antiemetics -IV antibiotics (Rocephin Day #2) -Ambulate and IS -SCD's and lovenox Hyperbilirubinemia ?Choledocholithiasis -[place] with dilated CBD concerning for CBD stone, LFT's are up.   -Will ask GI to see - Dr. Loreta Ave on call     LOS: 1 day    Nonie Hoyer 06/13/2015, 7:45 AM Pager: [phone]

## 2015-06-13 NOTE — Consult Note (Signed)
Unassigned patient Reason for Consult: Abnormal LFT's-needs an ERCP. Referring Physician: CCS.  Tricia Skinner is an 34 y.o. female.  HPI: 34 year old white female, 4 weeks post-partum after her second delivery, developed acute abdominal pain with nausea, a couple of days ago, came to the ER and was found to have gallstones with a dilated CBD and elevated LFT's. The pain radiated to her back. She denies having any such symptoms during her pregnancy. She denies having any problems with jaundice, vomiting, melena, hematochezia or GERD.     Past Medical History  Diagnosis Date  . Medical history non-contributory   . ROM (rupture of membranes), premature 05/17/2015   Past Surgical History  Procedure Laterality Date  . No past surgeries     History reviewed. No pertinent family history.  Social History:  reports that she has never smoked. She does not have any smokeless tobacco history on file. She reports that she does not drink alcohol or use illicit drugs.  Allergies: No Known Allergies  Medications: I have reviewed the patient's current medications.  Results for orders placed or performed during the hospital encounter of 06/12/15 (from the past 48 hour(s))  Lipase, blood     Status: None   Collection Time: 06/12/15  9:01 PM  Result Value Ref Range   Lipase 34 22 - 51 U/L  Comprehensive metabolic panel     Status: Abnormal   Collection Time: 06/12/15  9:01 PM  Result Value Ref Range   Sodium 139 135 - 145 mmol/L   Potassium 3.8 3.5 - 5.1 mmol/L   Chloride 103 101 - 111 mmol/L   CO2 27 22 - 32 mmol/L   Glucose, Bld 99 65 - 99 mg/dL   BUN 16 6 - 20 mg/dL   Creatinine, Ser 0.67 0.44 - 1.00 mg/dL   Calcium 9.5 8.9 - 10.3 mg/dL   Total Protein 7.4 6.5 - 8.1 g/dL   Albumin 4.2 3.5 - 5.0 g/dL   AST 324 (H) 15 - 41 U/L   ALT 259 (H) 14 - 54 U/L   Alkaline Phosphatase 178 (H) 38 - 126 U/L   Total Bilirubin 1.2 0.3 - 1.2 mg/dL   GFR calc non Af Amer >60 >60 mL/min   GFR calc Af  Amer >60 >60 mL/min    Comment: (NOTE) The eGFR has been calculated using the CKD EPI equation. This calculation has not been validated in all clinical situations. eGFR's persistently <60 mL/min signify possible Chronic Kidney Disease.    Anion gap 9 5 - 15  CBC     Status: None   Collection Time: 06/12/15  9:01 PM  Result Value Ref Range   WBC 10.2 4.0 - 10.5 K/uL   RBC 4.58 3.87 - 5.11 MIL/uL   Hemoglobin 13.5 12.0 - 15.0 g/dL   HCT 39.4 36.0 - 46.0 %   MCV 86.0 78.0 - 100.0 fL   MCH 29.5 26.0 - 34.0 pg   MCHC 34.3 30.0 - 36.0 g/dL   RDW 13.1 11.5 - 15.5 %   Platelets 251 150 - 400 K/uL  Urinalysis, Routine w reflex microscopic (not at [facility])     Status: Abnormal   Collection Time: 06/12/15 10:03 PM  Result Value Ref Range   Color, Urine AMBER (A) YELLOW    Comment: BIOCHEMICALS MAY BE AFFECTED BY COLOR   APPearance CLEAR CLEAR   Specific Gravity, Urine 1.022 1.005 - 1.030   pH 7.0 5.0 - 8.0   Glucose, UA NEGATIVE  NEGATIVE mg/dL   Hgb urine dipstick NEGATIVE NEGATIVE   Bilirubin Urine NEGATIVE NEGATIVE   Ketones, ur 15 (A) NEGATIVE mg/dL   Protein, ur NEGATIVE NEGATIVE mg/dL   Urobilinogen, UA 2.0 (H) 0.0 - 1.0 mg/dL   Nitrite NEGATIVE NEGATIVE   Leukocytes, UA MODERATE (A) NEGATIVE  Urine microscopic-add on     Status: Abnormal   Collection Time: 06/12/15 10:03 PM  Result Value Ref Range   WBC, UA 3-6 <3 WBC/hpf   Bacteria, UA FEW (A) RARE  CBC     Status: None   Collection Time: 06/13/15  1:55 AM  Result Value Ref Range   WBC 6.6 4.0 - 10.5 K/uL   RBC 4.29 3.87 - 5.11 MIL/uL   Hemoglobin 12.5 12.0 - 15.0 g/dL   HCT 37.5 36.0 - 46.0 %   MCV 87.4 78.0 - 100.0 fL   MCH 29.1 26.0 - 34.0 pg   MCHC 33.3 30.0 - 36.0 g/dL   RDW 13.3 11.5 - 15.5 %   Platelets 253 150 - 400 K/uL  Creatinine, serum     Status: None   Collection Time: 06/13/15  1:55 AM  Result Value Ref Range   Creatinine, Ser 0.66 0.44 - 1.00 mg/dL   GFR calc non Af Amer >60 >60 mL/min   GFR calc Af  Amer >60 >60 mL/min    Comment: (NOTE) The eGFR has been calculated using the CKD EPI equation. This calculation has not been validated in all clinical situations. eGFR's persistently <60 mL/min signify possible Chronic Kidney Disease.   Hepatic function panel     Status: Abnormal   Collection Time: 06/13/15  1:55 AM  Result Value Ref Range   Total Protein 6.5 6.5 - 8.1 g/dL   Albumin 3.7 3.5 - 5.0 g/dL   AST 509 (H) 15 - 41 U/L   ALT 402 (H) 14 - 54 U/L   Alkaline Phosphatase 166 (H) 38 - 126 U/L   Total Bilirubin 1.5 (H) 0.3 - 1.2 mg/dL   Bilirubin, Direct 0.8 (H) 0.1 - 0.5 mg/dL   Indirect Bilirubin 0.7 0.3 - 0.9 mg/dL   US Abdomen Complete  06/12/2015   CLINICAL DATA:  Upper abdominal pain and nausea for 1 week. Elevated LFTs.  EXAM: ULTRASOUND ABDOMEN COMPLETE  COMPARISON:  None.  FINDINGS: Gallbladder: Distended and contains a 1.7 cm shadowing gallstone. No gallbladder wall thickening, wall thickness of 1.8 mm. No sonographic Murphy sign noted.  Common bile duct: Diameter: 9-10 mm, distended.  Liver: No focal lesion identified. Within normal limits in parenchymal echogenicity. Normal directional flow in the main portal vein.  IVC: No abnormality visualized.  Pancreas: Visualized portion unremarkable. No pancreatic ductal dilatation visualized.  Spleen: Size and appearance within normal limits.  Right Kidney: Length: 10.5 cm. Echogenicity within normal limits. No mass or hydronephrosis visualized.  Left Kidney: Length: 11.8 cm. Echogenicity within normal limits. No mass or hydronephrosis visualized.  Abdominal aorta: No aneurysm visualized.  Other findings: No ascites.  IMPRESSION: Distended gallbladder with cholelithiasis, however no gallbladder wall thickening. Dilated common bile duct measuring 9-10 mm. Findings raise concern for choledocholithiasis, though a stone in the common bile duct is not seen sonographically.   Electronically Signed   By: Jeb Levering M.D.   On: 06/12/2015  22:58   Review of Systems  Constitutional: Negative.   HENT: Negative.   Eyes: Negative.   Respiratory: Negative.   Cardiovascular: Negative.   Gastrointestinal: Positive for nausea and abdominal pain. Negative for diarrhea,  constipation and blood in stool.  Musculoskeletal: Negative.   Skin: Negative.   Neurological: Negative.   Psychiatric/Behavioral: Negative.    Blood pressure 104/65, pulse 51, temperature 98.3 F (36.8 C), temperature source Oral, resp. rate 16, height _0  (1.626 m), weight 74.844 kg (165 lb), SpO2 98 %, currently breastfeeding. Physical Exam  Constitutional: She is oriented to person, place, and time. She appears well-developed and well-nourished.  HENT:  Head: Normocephalic and atraumatic.  Eyes: Conjunctivae and EOM are normal. Pupils are equal, round, and reactive to light.  Neck: Normal range of motion. Neck supple.  Cardiovascular: Normal rate and regular rhythm.   Respiratory: Effort normal and breath sounds normal.  GI: Soft. Bowel sounds are normal. She exhibits no distension. There is tenderness.  Neurological: She is alert and oriented to person, place, and time.  Skin: Skin is warm and dry.  Psychiatric: She has a normal mood and affect. Her behavior is normal. Judgment and thought content normal.   Assessment/Plan: Acute abdominal pain with gallstones and a dilated CBD with abnormal LFT's-4 weeks post-partum: ERCP scheduled for tomorrow. She is on Rocephin. Allow clears today.  Alter Moss 06/13/2015, 9:29 AM

## 2015-06-14 ENCOUNTER — Inpatient Hospital Stay (HOSPITAL_COMMUNITY): Payer: BLUE CROSS/BLUE SHIELD | Admitting: Anesthesiology

## 2015-06-14 ENCOUNTER — Encounter (HOSPITAL_COMMUNITY): Payer: Self-pay | Admitting: *Deleted

## 2015-06-14 ENCOUNTER — Inpatient Hospital Stay (HOSPITAL_COMMUNITY): Payer: BLUE CROSS/BLUE SHIELD

## 2015-06-14 ENCOUNTER — Encounter (HOSPITAL_COMMUNITY): Admission: EM | Disposition: A | Discharge status: Home / Self Care | Payer: Self-pay | Source: Home / Self Care

## 2015-06-14 HISTORY — PX: ERCP: SHX5425

## 2015-06-14 SURGERY — ERCP, WITH INTERVENTION IF INDICATED
Anesthesia: General

## 2015-06-14 MED ORDER — METOCLOPRAMIDE HCL 5 MG/ML IJ SOLN
INTRAMUSCULAR | Status: AC
  Filled 2015-06-14: qty 2

## 2015-06-14 MED ORDER — FENTANYL CITRATE (PF) 100 MCG/2ML IJ SOLN
INTRAMUSCULAR | Status: DC | PRN
  Administered 2015-06-14 (×2): 50 ug via INTRAVENOUS

## 2015-06-14 MED ORDER — ONDANSETRON HCL 4 MG/2ML IJ SOLN
INTRAMUSCULAR | Status: AC
  Filled 2015-06-14: qty 2

## 2015-06-14 MED ORDER — INDOMETHACIN 50 MG RE SUPP
100.0000 mg | Freq: Once | RECTAL | Status: AC
  Administered 2015-06-14: 100 mg via RECTAL
  Filled 2015-06-14: qty 2

## 2015-06-14 MED ORDER — INDOMETHACIN 50 MG RE SUPP
RECTAL | Status: AC
  Filled 2015-06-14: qty 2

## 2015-06-14 MED ORDER — PRENATAL MULTIVITAMIN CH
1.0000 | ORAL_TABLET | Freq: Every day | ORAL | Status: DC
  Filled 2015-06-14 (×2): qty 1

## 2015-06-14 MED ORDER — IOHEXOL 350 MG/ML SOLN
INTRAVENOUS | Status: DC | PRN
  Administered 2015-06-14: 30 mL

## 2015-06-14 MED ORDER — PROPOFOL 10 MG/ML IV BOLUS
INTRAVENOUS | Status: AC
  Filled 2015-06-14: qty 20

## 2015-06-14 MED ORDER — ONDANSETRON HCL 4 MG/2ML IJ SOLN
INTRAMUSCULAR | Status: DC | PRN
  Administered 2015-06-14 (×2): 4 mg via INTRAVENOUS

## 2015-06-14 MED ORDER — LACTATED RINGERS IV SOLN: INTRAVENOUS | Status: DC

## 2015-06-14 MED ORDER — LACTATED RINGERS IV SOLN
INTRAVENOUS | Status: DC | PRN
  Administered 2015-06-14: 13:00:00 via INTRAVENOUS

## 2015-06-14 MED ORDER — SODIUM CHLORIDE 0.9 % IV SOLN: INTRAVENOUS | Status: DC

## 2015-06-14 MED ORDER — FENTANYL CITRATE (PF) 100 MCG/2ML IJ SOLN
INTRAMUSCULAR | Status: AC
  Filled 2015-06-14: qty 4

## 2015-06-14 MED ORDER — MIDAZOLAM HCL 2 MG/2ML IJ SOLN
INTRAMUSCULAR | Status: AC
  Filled 2015-06-14: qty 4

## 2015-06-14 MED ORDER — SUCCINYLCHOLINE CHLORIDE 20 MG/ML IJ SOLN
INTRAMUSCULAR | Status: DC | PRN
  Administered 2015-06-14: 100 mg via INTRAVENOUS

## 2015-06-14 MED ORDER — METOCLOPRAMIDE HCL 5 MG/ML IJ SOLN
INTRAMUSCULAR | Status: DC | PRN
  Administered 2015-06-14: 10 mg via INTRAVENOUS

## 2015-06-14 MED ORDER — PROPOFOL 10 MG/ML IV BOLUS
INTRAVENOUS | Status: DC | PRN
  Administered 2015-06-14: 150 mg via INTRAVENOUS

## 2015-06-14 MED ORDER — LIDOCAINE HCL (CARDIAC) 20 MG/ML IV SOLN
INTRAVENOUS | Status: AC
  Filled 2015-06-14: qty 5

## 2015-06-14 NOTE — Anesthesia Procedure Notes (Signed)
Procedure Name: Intubation Date/Time: 06/14/2015 1:36 PM Performed by: Ludwig Lean Pre-anesthesia Checklist: Patient identified, Emergency Drugs available, Suction available and Patient being monitored Patient Re-evaluated:Patient Re-evaluated prior to inductionOxygen Delivery Method: Circle System Utilized Preoxygenation: Pre-oxygenation with 100% oxygen Intubation Type: IV induction Ventilation: Mask ventilation without difficulty Laryngoscope Size: Mac and 3 Grade View: Grade I Tube type: Oral Tube size: 7.0 mm Number of attempts: 1 Placement Confirmation: ETT inserted through vocal cords under direct vision,  positive ETCO2 and breath sounds checked- equal and bilateral Secured at: 20 cm Tube secured with: Tape Dental Injury: Teeth and Oropharynx as per pre-operative assessment

## 2015-06-14 NOTE — Transfer of Care (Signed)
Immediate Anesthesia Transfer of Care Note  Patient: Tricia Skinner  Procedure(s) Performed: Procedure(s): ENDOSCOPIC RETROGRADE CHOLANGIOPANCREATOGRAPHY (ERCP) (N/A)  Patient Location: PACU  Anesthesia Type:General  Level of Consciousness: Patient easily awoken, sedated, comfortable, cooperative, following commands, responds to stimulation.   Airway & Oxygen Therapy: Patient spontaneously breathing, ventilating well, oxygen via simple oxygen mask.  Post-op Assessment: Report given to PACU RN, vital signs reviewed and stable, moving all extremities.   Post vital signs: Reviewed and stable.  Complications: No apparent anesthesia complications

## 2015-06-14 NOTE — H&P (View-Only) (Signed)
Unassigned patient Reason for Consult: Abnormal LFT's-needs an ERCP. Referring Physician: CCS.  Tricia Skinner is an 34 y.o. female.  HPI: 34 year old white female, 4 weeks post-partum after her second delivery, developed acute abdominal pain with nausea, a couple of days ago, came to the ER and was found to have gallstones with a dilated CBD and elevated LFT's. The pain radiated to her back. She denies having any such symptoms during her pregnancy. She denies having any problems with jaundice, vomiting, melena, hematochezia or GERD.     Past Medical History  Diagnosis Date  . Medical history non-contributory   . ROM (rupture of membranes), premature 05/17/2015   Past Surgical History  Procedure Laterality Date  . No past surgeries     History reviewed. No pertinent family history.  Social History:  reports that she has never smoked. She does not have any smokeless tobacco history on file. She reports that she does not drink alcohol or use illicit drugs.  Allergies: No Known Allergies  Medications: I have reviewed the patient's current medications.  Results for orders placed or performed during the hospital encounter of 06/12/15 (from the past 48 hour(s))  Lipase, blood     Status: None   Collection Time: 06/12/15  9:01 PM  Result Value Ref Range   Lipase 34 22 - 51 U/L  Comprehensive metabolic panel     Status: Abnormal   Collection Time: 06/12/15  9:01 PM  Result Value Ref Range   Sodium 139 135 - 145 mmol/L   Potassium 3.8 3.5 - 5.1 mmol/L   Chloride 103 101 - 111 mmol/L   CO2 27 22 - 32 mmol/L   Glucose, Bld 99 65 - 99 mg/dL   BUN 16 6 - 20 mg/dL   Creatinine, Ser 0.67 0.44 - 1.00 mg/dL   Calcium 9.5 8.9 - 10.3 mg/dL   Total Protein 7.4 6.5 - 8.1 g/dL   Albumin 4.2 3.5 - 5.0 g/dL   AST 324 (H) 15 - 41 U/L   ALT 259 (H) 14 - 54 U/L   Alkaline Phosphatase 178 (H) 38 - 126 U/L   Total Bilirubin 1.2 0.3 - 1.2 mg/dL   GFR calc non Af Amer >60 >60 mL/min   GFR calc Af  Amer >60 >60 mL/min    Comment: (NOTE) The eGFR has been calculated using the CKD EPI equation. This calculation has not been validated in all clinical situations. eGFR's persistently <60 mL/min signify possible Chronic Kidney Disease.    Anion gap 9 5 - 15  CBC     Status: None   Collection Time: 06/12/15  9:01 PM  Result Value Ref Range   WBC 10.2 4.0 - 10.5 K/uL   RBC 4.58 3.87 - 5.11 MIL/uL   Hemoglobin 13.5 12.0 - 15.0 g/dL   HCT 39.4 36.0 - 46.0 %   MCV 86.0 78.0 - 100.0 fL   MCH 29.5 26.0 - 34.0 pg   MCHC 34.3 30.0 - 36.0 g/dL   RDW 13.1 11.5 - 15.5 %   Platelets 251 150 - 400 K/uL  Urinalysis, Routine w reflex microscopic (not at [facility])     Status: Abnormal   Collection Time: 06/12/15 10:03 PM  Result Value Ref Range   Color, Urine AMBER (A) YELLOW    Comment: BIOCHEMICALS MAY BE AFFECTED BY COLOR   APPearance CLEAR CLEAR   Specific Gravity, Urine 1.022 1.005 - 1.030   pH 7.0 5.0 - 8.0   Glucose, UA NEGATIVE  NEGATIVE mg/dL   Hgb urine dipstick NEGATIVE NEGATIVE   Bilirubin Urine NEGATIVE NEGATIVE   Ketones, ur 15 (A) NEGATIVE mg/dL   Protein, ur NEGATIVE NEGATIVE mg/dL   Urobilinogen, UA 2.0 (H) 0.0 - 1.0 mg/dL   Nitrite NEGATIVE NEGATIVE   Leukocytes, UA MODERATE (A) NEGATIVE  Urine microscopic-add on     Status: Abnormal   Collection Time: 06/12/15 10:03 PM  Result Value Ref Range   WBC, UA 3-6 <3 WBC/hpf   Bacteria, UA FEW (A) RARE  CBC     Status: None   Collection Time: 06/13/15  1:55 AM  Result Value Ref Range   WBC 6.6 4.0 - 10.5 K/uL   RBC 4.29 3.87 - 5.11 MIL/uL   Hemoglobin 12.5 12.0 - 15.0 g/dL   HCT 37.5 36.0 - 46.0 %   MCV 87.4 78.0 - 100.0 fL   MCH 29.1 26.0 - 34.0 pg   MCHC 33.3 30.0 - 36.0 g/dL   RDW 13.3 11.5 - 15.5 %   Platelets 253 150 - 400 K/uL  Creatinine, serum     Status: None   Collection Time: 06/13/15  1:55 AM  Result Value Ref Range   Creatinine, Ser 0.66 0.44 - 1.00 mg/dL   GFR calc non Af Amer >60 >60 mL/min   GFR calc Af  Amer >60 >60 mL/min    Comment: (NOTE) The eGFR has been calculated using the CKD EPI equation. This calculation has not been validated in all clinical situations. eGFR's persistently <60 mL/min signify possible Chronic Kidney Disease.   Hepatic function panel     Status: Abnormal   Collection Time: 06/13/15  1:55 AM  Result Value Ref Range   Total Protein 6.5 6.5 - 8.1 g/dL   Albumin 3.7 3.5 - 5.0 g/dL   AST 509 (H) 15 - 41 U/L   ALT 402 (H) 14 - 54 U/L   Alkaline Phosphatase 166 (H) 38 - 126 U/L   Total Bilirubin 1.5 (H) 0.3 - 1.2 mg/dL   Bilirubin, Direct 0.8 (H) 0.1 - 0.5 mg/dL   Indirect Bilirubin 0.7 0.3 - 0.9 mg/dL   US Abdomen Complete  06/12/2015   CLINICAL DATA:  Upper abdominal pain and nausea for 1 week. Elevated LFTs.  EXAM: ULTRASOUND ABDOMEN COMPLETE  COMPARISON:  None.  FINDINGS: Gallbladder: Distended and contains a 1.7 cm shadowing gallstone. No gallbladder wall thickening, wall thickness of 1.8 mm. No sonographic Murphy sign noted.  Common bile duct: Diameter: 9-10 mm, distended.  Liver: No focal lesion identified. Within normal limits in parenchymal echogenicity. Normal directional flow in the main portal vein.  IVC: No abnormality visualized.  Pancreas: Visualized portion unremarkable. No pancreatic ductal dilatation visualized.  Spleen: Size and appearance within normal limits.  Right Kidney: Length: 10.5 cm. Echogenicity within normal limits. No mass or hydronephrosis visualized.  Left Kidney: Length: 11.8 cm. Echogenicity within normal limits. No mass or hydronephrosis visualized.  Abdominal aorta: No aneurysm visualized.  Other findings: No ascites.  IMPRESSION: Distended gallbladder with cholelithiasis, however no gallbladder wall thickening. Dilated common bile duct measuring 9-10 mm. Findings raise concern for choledocholithiasis, though a stone in the common bile duct is not seen sonographically.   Electronically Signed   By: Jeb Levering M.D.   On: 06/12/2015  22:58   Review of Systems  Constitutional: Negative.   HENT: Negative.   Eyes: Negative.   Respiratory: Negative.   Cardiovascular: Negative.   Gastrointestinal: Positive for nausea and abdominal pain. Negative for diarrhea,  constipation and blood in stool.  Musculoskeletal: Negative.   Skin: Negative.   Neurological: Negative.   Psychiatric/Behavioral: Negative.    Blood pressure 104/65, pulse 51, temperature 98.3 F (36.8 C), temperature source Oral, resp. rate 16, height _0  (1.626 m), weight 74.844 kg (165 lb), SpO2 98 %, currently breastfeeding. Physical Exam  Constitutional: She is oriented to person, place, and time. She appears well-developed and well-nourished.  HENT:  Head: Normocephalic and atraumatic.  Eyes: Conjunctivae and EOM are normal. Pupils are equal, round, and reactive to light.  Neck: Normal range of motion. Neck supple.  Cardiovascular: Normal rate and regular rhythm.   Respiratory: Effort normal and breath sounds normal.  GI: Soft. Bowel sounds are normal. She exhibits no distension. There is tenderness.  Neurological: She is alert and oriented to person, place, and time.  Skin: Skin is warm and dry.  Psychiatric: She has a normal mood and affect. Her behavior is normal. Judgment and thought content normal.   Assessment/Plan: Acute abdominal pain with gallstones and a dilated CBD with abnormal LFT's-4 weeks post-partum: ERCP scheduled for tomorrow. She is on Rocephin. Allow clears today.  Fortune Brannigan 06/13/2015, 9:29 AM

## 2015-06-14 NOTE — Op Note (Signed)
[facility] [address] [place], [address]   ERCP PROCEDURE REPORT        EXAM DATE: Jul 06, 2015  PATIENT NAME:          Tricia Skinner, Tricia Skinner          MR #: [id number] BIRTHDATE:       1981-07-24     VISIT #:     [id number] ATTENDING:     Jeani Hawking, MD     STATUS:     inpatient ASSISTANT:      Nilsa Nutting and [place]   INDICATIONS:  The patient is a 34 yr old female here for an ERCP due to choledocholithiasis. PROCEDURE PERFORMED:     ERCP with removal of calculus/calculi  MEDICATIONS:     General Anesthesia  and post procedure rectal indomethacin 100 mg.  CONSENT: The patient understands the risks and benefits of the procedure and understands that these risks include, but are not limited to: sedation, allergic reaction, infection, perforation and/or bleeding. Alternative means of evaluation and treatment include, among others: physical exam, x-rays, and/or surgical intervention. The patient elects to proceed with this endoscopic procedure.  DESCRIPTION OF PROCEDURE: During intra-op preparation period all mechanical & medical equipment was checked for proper function. Hand hygiene and appropriate measures for infection prevention was taken. After the risks, benefits and alternatives of the procedure were thoroughly explained, Informed was verified, confirmed and timeout was successfully executed by the treatment team. With the patient in left semi-prone position, medications were administered intravenously.The    was passed from the mouth into the esophagus and further advanced from the esophagus into the stomach. From stomach scope was directed to the second portion of the duodenum. Major papilla was aligned with the duodenoscope. The scope position was confirmed fluoroscopically. Rest of the findings/therapeutics are given below. The scope was then completely withdrawn from the patient and the procedure completed. The pulse, BP, and  O2 saturation were monitored and documented by the physician and the nursing staff throughout the entire procedure. The patient was cared for as planned according to standard protocol. The patient was then discharged to recovery in stable condition and with appropriate post procedure care. Estimated blood loss is zero unless otherwise noted in this procedure report.  The ampulla was located the second portion of the duodenum.  No spontaneous drainage of bile noted.  The proximal PD was cannulated briefly 3-4 times.  Subsequently the CBD was cannulated and the guidewire was secured in the right intrahepatic ducts.  Contrast injection revealed a dilated CBD at 10 mm and there was some mild intrahepatic ductal dilation.  No overt evidence of any stones.  A 1 cm sphincterotomy was created and the duct was swept 3 times. During the fourth pass an occlusion cholangiogram was performed. No evidence of any stones with the extraction passes and no evidence of any retained stones with the occlusion cholangiogram. The gallstone was identified.    ADVERSE EVENT:     There were no overt immediate complications.  IMPRESSIONS:     1) Dilated CBD without evidence of stones. 2) Cholelithiasis.  RECOMMENDATIONS:     1) Lap chole per Surgery. 2) Monitor for signs/symptoms of post-ERCP pancreatitis. REPEAT EXAM:  ___________________________________ Jeani Hawking, MD eSigned:  Jeani Hawking, MD July 06, 2015 2:21 PM   cc:  CPT CODES: ICD9 CODES:  The ICD and CPT codes recommended by this software are interpretations from the data that the clinical staff has captured with the software.  The verification of the translation of this report to the ICD and CPT codes and modifiers is the sole responsibility of the health care institution and practicing physician where this report was generated.  [employer] will not be held responsible for the validity of the ICD and CPT codes  included on this report.  AMA assumes no liability for data contained or not contained herein. CPT is a Publishing rights manager of the [employer].   PATIENT NAME:  Tricia Skinner, Tricia Skinner MR#: [id number]

## 2015-06-14 NOTE — Interval H&P Note (Signed)
History and Physical Interval Note:  06/14/2015 1:25 PM  Tricia Skinner  has presented today for surgery, with the diagnosis of stones  The various methods of treatment have been discussed with the patient and family. After consideration of risks, benefits and other options for treatment, the patient has consented to  Procedure(s): ENDOSCOPIC RETROGRADE CHOLANGIOPANCREATOGRAPHY (ERCP) (N/A) as a surgical intervention .  The patient's history has been reviewed, patient examined, no change in status, stable for surgery.  I have reviewed the patient's chart and labs.  Questions were answered to the patient's satisfaction.     Tricia Skinner D

## 2015-06-14 NOTE — Progress Notes (Signed)
[facility] Progress Note     Subjective: Pt doing well.  No pain meds since midnight. Pending ERCP.  No currently N/V, but did throw up once around noon yesterday.  Ambulating well.    Objective: Vital signs in last 24 hours: Temp:  [97.8 F (36.6 C)-99.3 F (37.4 C)] 97.8 F (36.6 C) (09/15 0635) Pulse Rate:  [55-62] 55 (09/15 0635) Resp:  [15-16] 15 (09/15 0635) BP: (110-118)/(65-73) 113/73 mmHg (09/15 0635) SpO2:  [98 %-100 %] 100 % (09/15 0635) Last BM Date: 06/12/15  Intake/Output from previous day: 09/14 0701 - 09/15 0700 In: 2571.7 [P.O.:120; I.V.:2451.7] Out: 3300 [Urine:3300] Intake/Output this shift:    PE: Gen:  Alert, NAD, pleasant Abd: Soft, minimal tenderness, ND, +BS, no HSM, no abdominal scars noted  Lab Results:   Recent Labs  06/12/15 2101 06/13/15 0155  WBC 10.2 6.6  HGB 13.5 12.5  HCT 39.4 37.5  PLT 251 253   BMET  Recent Labs  06/12/15 2101 06/13/15 0155  NA 139  --   K 3.8  --   CL 103  --   CO2 27  --   GLUCOSE 99  --   BUN 16  --   CREATININE 0.67 0.66  CALCIUM 9.5  --    PT/INR No results for input(s): LABPROT, INR in the last 72 hours. CMP     Component Value Date/Time   NA 139 06/12/2015 2101   K 3.8 06/12/2015 2101   CL 103 06/12/2015 2101   CO2 27 06/12/2015 2101   GLUCOSE 99 06/12/2015 2101   BUN 16 06/12/2015 2101   CREATININE 0.66 06/13/2015 0155   CALCIUM 9.5 06/12/2015 2101   PROT 6.5 06/13/2015 0155   ALBUMIN 3.7 06/13/2015 0155   AST 509* 06/13/2015 0155   ALT 402* 06/13/2015 0155   ALKPHOS 166* 06/13/2015 0155   BILITOT 1.5* 06/13/2015 0155   GFRNONAA >60 06/13/2015 0155   GFRAA >60 06/13/2015 0155   Lipase     Component Value Date/Time   LIPASE 34 06/12/2015 2101       Studies/Results: US Abdomen Complete  06/12/2015   CLINICAL DATA:  Upper abdominal pain and nausea for 1 week. Elevated LFTs.  EXAM: ULTRASOUND ABDOMEN COMPLETE  COMPARISON:  None.  FINDINGS: Gallbladder:  Distended and contains a 1.7 cm shadowing gallstone. No gallbladder wall thickening, wall thickness of 1.8 mm. No sonographic Murphy sign noted.  Common bile duct: Diameter: 9-10 mm, distended.  Liver: No focal lesion identified. Within normal limits in parenchymal echogenicity. Normal directional flow in the main portal vein.  IVC: No abnormality visualized.  Pancreas: Visualized portion unremarkable. No pancreatic ductal dilatation visualized.  Spleen: Size and appearance within normal limits.  Right Kidney: Length: 10.5 cm. Echogenicity within normal limits. No mass or hydronephrosis visualized.  Left Kidney: Length: 11.8 cm. Echogenicity within normal limits. No mass or hydronephrosis visualized.  Abdominal aorta: No aneurysm visualized.  Other findings: No ascites.  IMPRESSION: Distended gallbladder with cholelithiasis, however no gallbladder wall thickening. Dilated common bile duct measuring 9-10 mm. Findings raise concern for choledocholithiasis, though a stone in the common bile duct is not seen sonographically.   Electronically Signed   By: Rubye Oaks M.D.   On: 06/12/2015 22:58    Anti-infectives: Anti-infectives    Start     Dose/Rate Route Frequency Ordered Stop   06/13/15 0100  cefTRIAXone (ROCEPHIN) 2 g in dextrose 5 % 50 mL IVPB     2 g 100 mL/hr  over 30 Minutes Intravenous Daily at bedtime 06/13/15 0040         Assessment/Plan Acute abdominal pain Biliary colic ?acute cholecystitis -NPO, IVF, pain control, antiemetics -IV antibiotics (Rocephin Day #3) -Ambulate and IS -SCD's and lovenox -Hopefully plan for lap chole tomorrow if all goes well with ERCP Hyperbilirubinemia ?Choledocholithiasis -[place] with dilated CBD concerning for CBD stone, LFT's are up.  -GI to perform ERCP today -Labs in am    LOS: 2 days    Nonie Hoyer 06/14/2015, 8:01 AM Pager: [phone]  I have seen and examined this patient and agree with the assessment and plan.  Will plan lap chole  tomorrow.     Matt B. Daphine Deutscher, MD, [facility] [phone] beeper [phone]  06/14/2015 3:06 PM

## 2015-06-14 NOTE — Anesthesia Postprocedure Evaluation (Signed)
  Anesthesia Post-op Note  Patient: Tricia Skinner  Procedure(s) Performed: Procedure(s) (LRB): ENDOSCOPIC RETROGRADE CHOLANGIOPANCREATOGRAPHY (ERCP) (N/A)  Patient Location: PACU  Anesthesia Type: General  Level of Consciousness: awake and alert   Airway and Oxygen Therapy: Patient Spontanous Breathing  Post-op Pain: mild  Post-op Assessment: Post-op Vital signs reviewed, Patient's Cardiovascular Status Stable, Respiratory Function Stable, Patent Airway and No signs of Nausea or vomiting  Last Vitals:  Filed Vitals:   06/14/15 1445  BP:   Pulse: 43  Temp:   Resp: 13    Post-op Vital Signs: stable   Complications: No apparent anesthesia complications

## 2015-06-15 ENCOUNTER — Encounter (HOSPITAL_COMMUNITY): Admission: EM | Disposition: A | Discharge status: Home / Self Care | Payer: Self-pay | Source: Home / Self Care

## 2015-06-15 ENCOUNTER — Inpatient Hospital Stay (HOSPITAL_COMMUNITY): Payer: BLUE CROSS/BLUE SHIELD | Admitting: Certified Registered Nurse Anesthetist

## 2015-06-15 ENCOUNTER — Encounter (HOSPITAL_COMMUNITY): Payer: Self-pay | Admitting: Gastroenterology

## 2015-06-15 ENCOUNTER — Inpatient Hospital Stay (HOSPITAL_COMMUNITY): Payer: BLUE CROSS/BLUE SHIELD

## 2015-06-15 HISTORY — PX: CHOLECYSTECTOMY: SHX55

## 2015-06-15 LAB — COMPREHENSIVE METABOLIC PANEL
ALT: 474 U/L — AB (ref 14–54)
AST: 205 U/L — AB (ref 15–41)
Albumin: 3.2 g/dL — ABNORMAL LOW (ref 3.5–5.0)
Alkaline Phosphatase: 221 U/L — ABNORMAL HIGH (ref 38–126)
Anion gap: 5 (ref 5–15)
BILIRUBIN TOTAL: 1.1 mg/dL (ref 0.3–1.2)
BUN: 6 mg/dL (ref 6–20)
CO2: 25 mmol/L (ref 22–32)
CREATININE: 0.66 mg/dL (ref 0.44–1.00)
Calcium: 8.8 mg/dL — ABNORMAL LOW (ref 8.9–10.3)
Chloride: 111 mmol/L (ref 101–111)
Glucose, Bld: 87 mg/dL (ref 65–99)
Potassium: 4 mmol/L (ref 3.5–5.1)
Sodium: 141 mmol/L (ref 135–145)
TOTAL PROTEIN: 5.8 g/dL — AB (ref 6.5–8.1)

## 2015-06-15 LAB — CBC
HCT: 33.4 % — ABNORMAL LOW (ref 36.0–46.0)
Hemoglobin: 11.3 g/dL — ABNORMAL LOW (ref 12.0–15.0)
MCH: 29.7 pg (ref 26.0–34.0)
MCHC: 33.8 g/dL (ref 30.0–36.0)
MCV: 87.7 fL (ref 78.0–100.0)
PLATELETS: 212 10*3/uL (ref 150–400)
RBC: 3.81 MIL/uL — AB (ref 3.87–5.11)
RDW: 13.7 % (ref 11.5–15.5)
WBC: 6 10*3/uL (ref 4.0–10.5)

## 2015-06-15 LAB — SURGICAL PCR SCREEN
MRSA, PCR: NEGATIVE
STAPHYLOCOCCUS AUREUS: NEGATIVE

## 2015-06-15 LAB — LIPASE, BLOOD: Lipase: 180 U/L — ABNORMAL HIGH (ref 22–51)

## 2015-06-15 SURGERY — LAPAROSCOPIC CHOLECYSTECTOMY WITH INTRAOPERATIVE CHOLANGIOGRAM
Anesthesia: General

## 2015-06-15 MED ORDER — FENTANYL CITRATE (PF) 250 MCG/5ML IJ SOLN
INTRAMUSCULAR | Status: AC
  Filled 2015-06-15: qty 25

## 2015-06-15 MED ORDER — IOHEXOL 300 MG/ML  SOLN
INTRAMUSCULAR | Status: DC | PRN
  Administered 2015-06-15: 2.5 mL via INTRAVENOUS

## 2015-06-15 MED ORDER — FENTANYL CITRATE (PF) 100 MCG/2ML IJ SOLN
INTRAMUSCULAR | Status: DC | PRN
  Administered 2015-06-15 (×2): 100 ug via INTRAVENOUS
  Administered 2015-06-15: 50 ug via INTRAVENOUS

## 2015-06-15 MED ORDER — KCL IN DEXTROSE-NACL 20-5-0.45 MEQ/L-%-% IV SOLN
INTRAVENOUS | Status: DC
  Administered 2015-06-15: 20:00:00 via INTRAVENOUS
  Filled 2015-06-15 (×3): qty 1000

## 2015-06-15 MED ORDER — LACTATED RINGERS IR SOLN
Status: DC | PRN
  Administered 2015-06-15: 1

## 2015-06-15 MED ORDER — ROCURONIUM BROMIDE 100 MG/10ML IV SOLN
INTRAVENOUS | Status: DC | PRN
  Administered 2015-06-15: 10 mg via INTRAVENOUS
  Administered 2015-06-15: 30 mg via INTRAVENOUS

## 2015-06-15 MED ORDER — DEXTROSE 5 % IV SOLN: 2.0000 g | INTRAVENOUS | Status: DC

## 2015-06-15 MED ORDER — BUPIVACAINE-EPINEPHRINE (PF) 0.25% -1:200000 IJ SOLN
INTRAMUSCULAR | Status: AC
  Filled 2015-06-15: qty 30

## 2015-06-15 MED ORDER — ONDANSETRON HCL 4 MG/2ML IJ SOLN: 4.0000 mg | Freq: Once | INTRAMUSCULAR | Status: DC | PRN

## 2015-06-15 MED ORDER — BUPIVACAINE-EPINEPHRINE 0.25% -1:200000 IJ SOLN
INTRAMUSCULAR | Status: DC | PRN
  Administered 2015-06-15: 20 mL

## 2015-06-15 MED ORDER — GLYCOPYRROLATE 0.2 MG/ML IJ SOLN
INTRAMUSCULAR | Status: DC | PRN
  Administered 2015-06-15 (×2): 0.4 mg via INTRAVENOUS

## 2015-06-15 MED ORDER — SODIUM CHLORIDE 0.9 % IJ SOLN
INTRAMUSCULAR | Status: AC
  Filled 2015-06-15: qty 20

## 2015-06-15 MED ORDER — DEXAMETHASONE SODIUM PHOSPHATE 4 MG/ML IJ SOLN
INTRAMUSCULAR | Status: DC | PRN
  Administered 2015-06-15: 10 mg via INTRAVENOUS

## 2015-06-15 MED ORDER — HEPARIN SODIUM (PORCINE) 5000 UNIT/ML IJ SOLN
5000.0000 [IU] | Freq: Three times a day (TID) | INTRAMUSCULAR | Status: DC
  Administered 2015-06-15 – 2015-06-16 (×2): 5000 [IU] via SUBCUTANEOUS
  Filled 2015-06-15 (×8): qty 1

## 2015-06-15 MED ORDER — ONDANSETRON HCL 4 MG/2ML IJ SOLN: 4.0000 mg | Freq: Four times a day (QID) | INTRAMUSCULAR | Status: DC | PRN

## 2015-06-15 MED ORDER — LIDOCAINE HCL (CARDIAC) 20 MG/ML IV SOLN
INTRAVENOUS | Status: DC | PRN
  Administered 2015-06-15: 100 mg via INTRAVENOUS

## 2015-06-15 MED ORDER — NEOSTIGMINE METHYLSULFATE 10 MG/10ML IV SOLN
INTRAVENOUS | Status: DC | PRN
  Administered 2015-06-15: 3 mg via INTRAVENOUS

## 2015-06-15 MED ORDER — LIDOCAINE HCL (CARDIAC) 20 MG/ML IV SOLN
INTRAVENOUS | Status: AC
  Filled 2015-06-15: qty 5

## 2015-06-15 MED ORDER — ROCURONIUM BROMIDE 100 MG/10ML IV SOLN
INTRAVENOUS | Status: AC
  Filled 2015-06-15: qty 1

## 2015-06-15 MED ORDER — PROPOFOL 10 MG/ML IV BOLUS
INTRAVENOUS | Status: AC
  Filled 2015-06-15: qty 20

## 2015-06-15 MED ORDER — 0.9 % SODIUM CHLORIDE (POUR BTL) OPTIME
TOPICAL | Status: DC | PRN
  Administered 2015-06-15: 1000 mL

## 2015-06-15 MED ORDER — FENTANYL CITRATE (PF) 100 MCG/2ML IJ SOLN: 25.0000 ug | INTRAMUSCULAR | Status: DC | PRN

## 2015-06-15 MED ORDER — METOCLOPRAMIDE HCL 5 MG/ML IJ SOLN
INTRAMUSCULAR | Status: DC | PRN
  Administered 2015-06-15: 10 mg via INTRAVENOUS

## 2015-06-15 MED ORDER — PROPOFOL 10 MG/ML IV BOLUS
INTRAVENOUS | Status: DC | PRN
  Administered 2015-06-15: 140 mg via INTRAVENOUS

## 2015-06-15 MED ORDER — ONDANSETRON HCL 4 MG/2ML IJ SOLN
INTRAMUSCULAR | Status: DC | PRN
  Administered 2015-06-15: 4 mg via INTRAVENOUS

## 2015-06-15 MED ORDER — MIDAZOLAM HCL 5 MG/5ML IJ SOLN
INTRAMUSCULAR | Status: DC | PRN
  Administered 2015-06-15: 2 mg via INTRAVENOUS

## 2015-06-15 MED ORDER — DEXAMETHASONE SODIUM PHOSPHATE 10 MG/ML IJ SOLN
INTRAMUSCULAR | Status: AC
  Filled 2015-06-15: qty 1

## 2015-06-15 MED ORDER — SUCCINYLCHOLINE CHLORIDE 20 MG/ML IJ SOLN
INTRAMUSCULAR | Status: DC | PRN
  Administered 2015-06-15: 100 mg via INTRAVENOUS

## 2015-06-15 MED ORDER — ONDANSETRON HCL 4 MG/2ML IJ SOLN
INTRAMUSCULAR | Status: AC
  Filled 2015-06-15: qty 2

## 2015-06-15 MED ORDER — HYDROCODONE-ACETAMINOPHEN 5-325 MG PO TABS
1.0000 | ORAL_TABLET | ORAL | Status: DC | PRN
  Administered 2015-06-16 (×2): 2 via ORAL
  Filled 2015-06-15 (×2): qty 2

## 2015-06-15 MED ORDER — LACTATED RINGERS IV SOLN
INTRAVENOUS | Status: DC
  Administered 2015-06-15: 1000 mL via INTRAVENOUS
  Administered 2015-06-15: 16:00:00 via INTRAVENOUS

## 2015-06-15 MED ORDER — MORPHINE SULFATE (PF) 2 MG/ML IV SOLN
1.0000 mg | INTRAVENOUS | Status: DC | PRN
  Administered 2015-06-15 – 2015-06-16 (×5): 1 mg via INTRAVENOUS
  Filled 2015-06-15 (×5): qty 1

## 2015-06-15 MED ORDER — ONDANSETRON 4 MG PO TBDP: 4.0000 mg | ORAL_TABLET | Freq: Four times a day (QID) | ORAL | Status: DC | PRN

## 2015-06-15 MED ORDER — MIDAZOLAM HCL 2 MG/2ML IJ SOLN
INTRAMUSCULAR | Status: AC
  Filled 2015-06-15: qty 4

## 2015-06-15 SURGICAL SUPPLY — 36 items
APPLIER CLIP ROT 10 11.4 M/L (STAPLE) ×3
BENZOIN TINCTURE PRP APPL 2/3 (GAUZE/BANDAGES/DRESSINGS) IMPLANT
CABLE HIGH FREQUENCY MONO STRZ (ELECTRODE) IMPLANT
CATH REDDICK CHOLANGI 4FR 50CM (CATHETERS) ×3 IMPLANT
CLIP APPLIE ROT 10 11.4 M/L (STAPLE) ×1 IMPLANT
CLOSURE WOUND 1/2 X4 (GAUZE/BANDAGES/DRESSINGS)
COVER MAYO STAND STRL (DRAPES) ×3 IMPLANT
COVER SURGICAL LIGHT HANDLE (MISCELLANEOUS) ×3 IMPLANT
DECANTER SPIKE VIAL GLASS SM (MISCELLANEOUS) IMPLANT
DRAPE C-ARM 42X120 X-RAY (DRAPES) ×3 IMPLANT
DRAPE LAPAROSCOPIC ABDOMINAL (DRAPES) ×3 IMPLANT
ELECT PENCIL ROCKER SW 15FT (MISCELLANEOUS) ×3 IMPLANT
ELECT REM PT RETURN 9FT ADLT (ELECTROSURGICAL) ×3
ELECTRODE REM PT RTRN 9FT ADLT (ELECTROSURGICAL) ×1 IMPLANT
GLOVE BIOGEL M 8.0 STRL (GLOVE) ×3 IMPLANT
GOWN STRL REUS W/TWL XL LVL3 (GOWN DISPOSABLE) ×12 IMPLANT
HEMOSTAT SURGICEL 4X8 (HEMOSTASIS) IMPLANT
IV CATH 14GX2 1/4 (CATHETERS) ×3 IMPLANT
KIT BASIN OR (CUSTOM PROCEDURE TRAY) ×3 IMPLANT
L-HOOK LAP DISP 36CM (ELECTROSURGICAL) ×3
LHOOK LAP DISP 36CM (ELECTROSURGICAL) ×1 IMPLANT
LIQUID BAND (GAUZE/BANDAGES/DRESSINGS) ×3 IMPLANT
POUCH RETRIEVAL ECOSAC 10 (ENDOMECHANICALS) IMPLANT
POUCH RETRIEVAL ECOSAC 10MM (ENDOMECHANICALS)
SCISSORS LAP 5X45 EPIX DISP (ENDOMECHANICALS) ×3 IMPLANT
SCRUB PCMX 4 OZ (MISCELLANEOUS) ×3 IMPLANT
SET IRRIG TUBING LAPAROSCOPIC (IRRIGATION / IRRIGATOR) ×3 IMPLANT
SLEEVE XCEL OPT CAN 5 100 (ENDOMECHANICALS) ×6 IMPLANT
STRIP CLOSURE SKIN 1/2X4 (GAUZE/BANDAGES/DRESSINGS) IMPLANT
SUT VIC AB 4-0 SH 18 (SUTURE) ×3 IMPLANT
SYR 20CC LL (SYRINGE) ×3 IMPLANT
TOWEL OR 17X26 10 PK STRL BLUE (TOWEL DISPOSABLE) ×3 IMPLANT
TRAY LAPAROSCOPIC (CUSTOM PROCEDURE TRAY) ×3 IMPLANT
TROCAR BLADELESS OPT 5 100 (ENDOMECHANICALS) ×3 IMPLANT
TROCAR XCEL BLUNT TIP 100MML (ENDOMECHANICALS) IMPLANT
TROCAR XCEL NON-BLD 11X100MML (ENDOMECHANICALS) ×3 IMPLANT

## 2015-06-15 NOTE — Discharge Instructions (Signed)
Laparoscopic Cholecystectomy, Care After °Refer to this sheet in the next few weeks. These instructions provide you with information on caring for yourself after your procedure. Your health care provider may also give you more specific instructions. Your treatment has been planned according to current medical practices, but problems sometimes occur. Call your health care provider if you have any problems or questions after your procedure. °WHAT TO EXPECT AFTER THE PROCEDURE °After your procedure, it is typical to have the following: °· Pain at your incision sites. You will be given pain medicines to control the pain. °· Mild nausea or vomiting. This should improve after the first 24 hours. °· Bloating and possibly shoulder pain from the gas used during the procedure. This will improve after the first 24 hours. °HOME CARE INSTRUCTIONS  °· Change bandages (dressings) as directed by your health care provider. °· Keep the wound dry and clean. You may wash the wound gently with soap and water. Gently blot or dab the area dry. °· Do not take baths or use swimming pools or hot tubs for 2 weeks or until your health care provider approves. °· Only take over-the-counter or prescription medicines as directed by your health care provider. °· Continue your normal diet as directed by your health care provider. °· Do not lift anything heavier than 10 pounds (4.5 kg) until your health care provider approves. °· Do not play contact sports for 1 week or until your health care provider approves. °SEEK MEDICAL CARE IF:  °· You have redness, swelling, or increasing pain in the wound. °· You notice yellowish-white fluid (pus) coming from the wound. °· You have drainage from the wound that lasts longer than 1 day. °· You notice a bad smell coming from the wound or dressing. °· Your surgical cuts (incisions) break open. °SEEK IMMEDIATE MEDICAL CARE IF:  °· You develop a rash. °· You have difficulty breathing. °· You have chest pain. °· You  have a fever. °· You have increasing pain in the shoulders (shoulder strap areas). °· You have dizzy episodes or faint while standing. °· You have severe abdominal pain. °· You feel sick to your stomach (nauseous) or throw up (vomit) and this lasts for more than 1 day. °Document Released: 09/15/2005 Document Revised: 07/06/2013 Document Reviewed: 04/27/2013 °ExitCare® Patient Information ©2015 [employer]. This information is not intended to replace advice given to you by your health care provider. Make sure you discuss any questions you have with your health care provider. ° °[facility] ______[facility] °LAPAROSCOPIC SURGERY: POST OP INSTRUCTIONS °Always review your discharge instruction sheet given to you by the facility where your surgery was performed. °IF YOU HAVE DISABILITY OR FAMILY LEAVE FORMS, YOU MUST BRING THEM TO THE OFFICE FOR PROCESSING.   °DO NOT GIVE THEM TO YOUR DOCTOR. ° °1. A prescription for pain medication may be given to you upon discharge.  Take your pain medication as prescribed, if needed.  If narcotic pain medicine is not needed, then you may take acetaminophen (Tylenol) or ibuprofen (Advil) as needed. °2. Take your usually prescribed medications unless otherwise directed. °3. If you need a refill on your pain medication, please contact your pharmacy.  They will contact our office to request authorization. Prescriptions will not be filled after 5pm or on week-ends. °4. You should follow a light diet the first few days after arrival home, such as soup and crackers, etc.  Be sure to include lots of fluids daily. °5. Most patients will experience some   swelling and bruising in the area of the incisions.  Ice packs will help.  Swelling and bruising can take several days to resolve.  °6. It is common to experience some constipation if taking pain medication after surgery.  Increasing fluid intake and taking a stool softener (such as Colace) will usually help or prevent this problem  from occurring.  A mild laxative (Milk of Magnesia or Miralax) should be taken according to package instructions if there are no bowel movements after 48 hours. °7. Unless discharge instructions indicate otherwise, you may remove your bandages 24-48 hours after surgery, and you may shower at that time.  You may have steri-strips (small skin tapes) in place directly over the incision.  These strips should be left on the skin for 7-10 days.  If your surgeon used skin glue on the incision, you may shower in 24 hours.  The glue will flake off over the next 2-3 weeks.  Any sutures or staples will be removed at the office during your follow-up visit. °8. ACTIVITIES:  You may resume regular (light) daily activities beginning the next day--such as daily self-care, walking, climbing stairs--gradually increasing activities as tolerated.  You may have sexual intercourse when it is comfortable.  Refrain from any heavy lifting or straining until approved by your doctor. °a. You may drive when you are no longer taking prescription pain medication, you can comfortably wear a seatbelt, and you can safely maneuver your car and apply brakes. °b. RETURN TO WORK:  __________________________________________________________ °9. You should see your doctor in the office for a follow-up appointment approximately 2-3 weeks after your surgery.  Make sure that you call for this appointment within a day or two after you arrive home to insure a convenient appointment time. °10. OTHER INSTRUCTIONS: __________________________________________________________________________________________________________________________ __________________________________________________________________________________________________________________________ °WHEN TO CALL YOUR DOCTOR: °1. Fever over 101.0 °2. Inability to urinate °3. Continued bleeding from incision. °4. Increased pain, redness, or drainage from the incision. °5. Increasing abdominal pain ° °The  clinic staff is available to answer your questions during regular business hours.  Please don’t hesitate to call and ask to speak to one of the nurses for clinical concerns.  If you have a medical emergency, go to the nearest emergency room or call 911.  A surgeon from [facility] is always on call at the hospital. °[address], [address], [place], [place]  [address] ? [address] [address], [place], [place]   [address] °[phone] ? [phone] ? FAX [phone] °Web site: [email or web address] °

## 2015-06-15 NOTE — Anesthesia Preprocedure Evaluation (Addendum)
Anesthesia Evaluation  Patient identified by MRN, date of birth, ID band Patient awake    Reviewed: Allergy & Precautions, NPO status , Patient's Chart, lab work & pertinent test results  History of Anesthesia Complications (+) PONV and history of anesthetic complications  Airway Mallampati: II  TM Distance: >3 FB Neck ROM: Full   Comment: Fever blisters on upper lip x2 Dental no notable dental hx. (+) Dental Advisory Given   Pulmonary neg pulmonary ROS,    Pulmonary exam normal breath sounds clear to auscultation       Cardiovascular negative cardio ROS Normal cardiovascular exam Rhythm:Regular Rate:Normal     Neuro/Psych negative neurological ROS  negative psych ROS   GI/Hepatic negative GI ROS, Neg liver ROS,   Endo/Other  negative endocrine ROS  Renal/GU negative Renal ROS  negative genitourinary   Musculoskeletal negative musculoskeletal ROS (+)   Abdominal   Peds negative pediatric ROS (+)  Hematology negative hematology ROS (+)   Anesthesia Other Findings   Reproductive/Obstetrics Post partum 4 weeks.                            Anesthesia Physical Anesthesia Plan  ASA: II  Anesthesia Plan: General   Post-op Pain Management:    Induction: Intravenous  Airway Management Planned: Oral ETT  Additional Equipment:   Intra-op Plan:   Post-operative Plan: Extubation in OR  Informed Consent: I have reviewed the patients History and Physical, chart, labs and discussed the procedure including the risks, benefits and alternatives for the proposed anesthesia with the patient or authorized representative who has indicated his/her understanding and acceptance.   Dental advisory given  Plan Discussed with: CRNA  Anesthesia Plan Comments:         Anesthesia Quick Evaluation

## 2015-06-15 NOTE — Anesthesia Procedure Notes (Signed)
Procedure Name: Intubation Date/Time: 06/15/2015 2:40 PM Performed by: Ludwig Lean Pre-anesthesia Checklist: Patient identified, Emergency Drugs available, Suction available and Patient being monitored Patient Re-evaluated:Patient Re-evaluated prior to inductionOxygen Delivery Method: Circle System Utilized Preoxygenation: Pre-oxygenation with 100% oxygen Intubation Type: IV induction Ventilation: Mask ventilation without difficulty Laryngoscope Size: Mac and 4 Tube type: Oral Tube size: 7.0 mm Number of attempts: 1 Airway Equipment and Method: Stylet and Oral airway Placement Confirmation: ETT inserted through vocal cords under direct vision,  positive ETCO2 and breath sounds checked- equal and bilateral Secured at: 21 cm Tube secured with: Tape Dental Injury: Teeth and Oropharynx as per pre-operative assessment

## 2015-06-15 NOTE — Op Note (Signed)
Arlett Goold   06/15/2015  4:14 PM  Procedure: Laparoscopic Cholecystectomy with intraoperative cholangiogram  Surgeon: Susy Frizzle B. Daphine Deutscher, MD, FACS Asst:  none  Anes:  General  Drains:  None  Findings: Acute cholecystitis  Description of Procedure: The patient was taken to OR 1 and given general anesthesia.  The patient was prepped with PCMX and draped sterilely. A time out was performed.  Access to the abdomen was achieved with a 5 mm Optiview through the right upper quadrant without difficulty.  Port placement included three 5 mm trocars and one 11 mm trocar.    The gallbladder was visualized and the fundus was grasped and the gallbladder was elevated. Traction on the infundibulum allowed for successful demonstration of the critical view. Inflammatory changes were acute and chronic.  The cystic duct was identified and clipped up on the gallbladder and an incision was made in the cystic duct and the Reddick catheter was inserted after milking the cystic duct of any debris. A dynamic cholangiogram was performed which demonstrated a tortuous cystic duct and free intrahepatic and distal flow of contrast into the duodenum.    The cystic duct was then triple clipped and divided, the cystic artery was double clipped and divided and then the gallbladder was removed from the gallbladder bed. Removal of the gallbladder from the gallbladder bed was performed without entering it.  The gallbladder was then placed in a bag and brought out through one of the trocar sites. The gallbladder bed was inspected and no bleeding or bile leaks were seen.      Incisions were injected with marcaine and closed with 4-0 Vicryl and Dermabond on the skin.  Sponge and needle count were correct.    The patient was taken to the recovery room in satisfactory condition.

## 2015-06-15 NOTE — Transfer of Care (Signed)
Immediate Anesthesia Transfer of Care Note  Patient: Tricia Skinner  Procedure(s) Performed: Procedure(s): LAPAROSCOPIC CHOLECYSTECTOMY WITH INTRAOPERATIVE CHOLANGIOGRAM (N/A)  Patient Location: PACU  Anesthesia Type:General  Level of Consciousness: awake, alert  and oriented  Airway & Oxygen Therapy: Patient Spontanous Breathing and Patient connected to face mask oxygen  Post-op Assessment: Report given to RN and Post -op Vital signs reviewed and stable  Post vital signs: Reviewed and stable  Last Vitals:  Filed Vitals:   06/15/15 1059  BP: 130/80  Pulse: 47  Temp: 36.7 C  Resp: 12    Complications: No apparent anesthesia complications

## 2015-06-16 LAB — COMPREHENSIVE METABOLIC PANEL WITH GFR
ALT: 360 U/L — ABNORMAL HIGH (ref 14–54)
AST: 98 U/L — ABNORMAL HIGH (ref 15–41)
Albumin: 3.4 g/dL — ABNORMAL LOW (ref 3.5–5.0)
Alkaline Phosphatase: 222 U/L — ABNORMAL HIGH (ref 38–126)
Anion gap: 10 (ref 5–15)
BUN: 7 mg/dL (ref 6–20)
CO2: 24 mmol/L (ref 22–32)
Calcium: 9.1 mg/dL (ref 8.9–10.3)
Chloride: 107 mmol/L (ref 101–111)
Creatinine, Ser: 0.6 mg/dL (ref 0.44–1.00)
GFR calc Af Amer: >60 mL/min
GFR calc non Af Amer: >60 mL/min
Glucose, Bld: 96 mg/dL (ref 65–99)
Potassium: 4.1 mmol/L (ref 3.5–5.1)
Sodium: 141 mmol/L (ref 135–145)
Total Bilirubin: 0.9 mg/dL (ref 0.3–1.2)
Total Protein: 6.8 g/dL (ref 6.5–8.1)

## 2015-06-16 MED ORDER — HYDROCODONE-ACETAMINOPHEN 5-325 MG PO TABS: 1.0000 | ORAL_TABLET | Freq: Four times a day (QID) | ORAL | Status: AC | PRN

## 2015-06-16 MED ORDER — KETOROLAC TROMETHAMINE 30 MG/ML IJ SOLN
30.0000 mg | Freq: Once | INTRAMUSCULAR | Status: AC
  Administered 2015-06-16: 30 mg via INTRAVENOUS
  Filled 2015-06-16: qty 1

## 2015-06-16 NOTE — Discharge Summary (Signed)
Physician Discharge Summary  Patient ID: Tricia Skinner MRN: [id number] DOB/AGE: [birth year] 34 y.o.  Admit date: 06/12/2015 Discharge date: 06/16/2015  Admission Diagnoses:  Discharge Diagnoses:  Active Problems:   Cholelithiasis   Discharged Condition: good  Hospital Course: admitted for abdominal pain, found to have cholecystitis. She underwent laparoscopic cholecystectomy. POD 1 she was tolerating a diet and pain was under control. She was discharged later that day.  Consults: None  Significant Diagnostic Studies: US showing distended GB with stones  Treatments: antibiotics: ceftriaxone, analgesia: Vicodin and surgery: lap chole  Discharge Exam: Blood pressure 105/59, pulse 55, temperature 97.7 F (36.5 C), temperature source Oral, resp. rate 18, height  (1.626 m), weight 74.844 kg (165 lb), SpO2 99 %, currently breastfeeding. General appearance: cooperative and appears stated age Resp: clear to auscultation bilaterally Cardio: regular rate and rhythm, S1, S2 normal, no murmur, click, rub or gallop GI: soft, non-tender; bowel sounds normal; no masses,  no organomegaly  Disposition: 01-Home or Self Care  Discharge Instructions    Diet - low sodium heart healthy    Complete by:  As directed      Discharge wound care:    Complete by:  As directed   Ok to shower tomorrow, dab dry. Do not submerge for 2 weeks. Call office if wounds become reddened or have drainage     Increase activity slowly    Complete by:  As directed             Medication List    STOP taking these medications        oxyCODONE-acetaminophen 5-325 MG per tablet  Commonly known as:  PERCOCET/ROXICET      TAKE these medications        docusate sodium 100 MG capsule  Commonly known as:  COLACE  Take 100 mg by mouth 2 (two) times daily.     HYDROcodone-acetaminophen 5-325 MG per tablet  Commonly known as:  NORCO/VICODIN  Take 1 tablet by mouth every 6 (six) hours as needed for moderate  pain.     ibuprofen 600 MG tablet  Commonly known as:  ADVIL,MOTRIN  Take 1 tablet (600 mg total) by mouth every 6 (six) hours.     PRENATAL VITAMIN PO  Take 1 tablet by mouth daily.           Follow-up Information    Follow up with [facility] [place] On 07/03/2015.   Why:  Call Monday and confirm appointment for at 3:15 PM at our office, "[facility]" [facility].  Our office is setting this up, make sure it is set up.  Be at the office 30 minutes early for check in.   Contact information:   [address] [place] [address] [phone]      Signed: De Blanch Kinsinger 06/16/2015, 2:31 PM

## 2015-06-16 NOTE — Anesthesia Postprocedure Evaluation (Signed)
  Anesthesia Post-op Note  Patient: Tricia Skinner  Procedure(s) Performed: Procedure(s) (LRB): LAPAROSCOPIC CHOLECYSTECTOMY WITH INTRAOPERATIVE CHOLANGIOGRAM (N/A)  Patient Location: PACU  Anesthesia Type: General  Level of Consciousness: awake and alert   Airway and Oxygen Therapy: Patient Spontanous Breathing  Post-op Pain: mild  Post-op Assessment: Post-op Vital signs reviewed, Patient's Cardiovascular Status Stable, Respiratory Function Stable, Patent Airway and No signs of Nausea or vomiting  Last Vitals:  Filed Vitals:   06/16/15 0900  BP: 127/83  Pulse: 44  Temp: 36.9 C  Resp: 16    Post-op Vital Signs: stable   Complications: No apparent anesthesia complications

## 2016-05-24 IMAGING — RF DG CHOLANGIOGRAM OPERATIVE
1 series · 3 of 3 positions shown · non-contrast
Comparison: ERCP 06/14/2015.  Ultrasound the abdomen 06/12/2015.

CLINICAL DATA: Cholecystitis.  RIGHT upper quadrant pain.

EXAM:
INTRAOPERATIVE CHOLANGIOGRAM
TECHNIQUE: Cholangiographic images from the C-arm fluoroscopic device were
submitted for interpretation post-operatively. Please see the
procedural report for the amount of contrast and the fluoroscopy
time utilized.

[Series 1: run · 3 of 91 frames shown]
[frame 14/91]
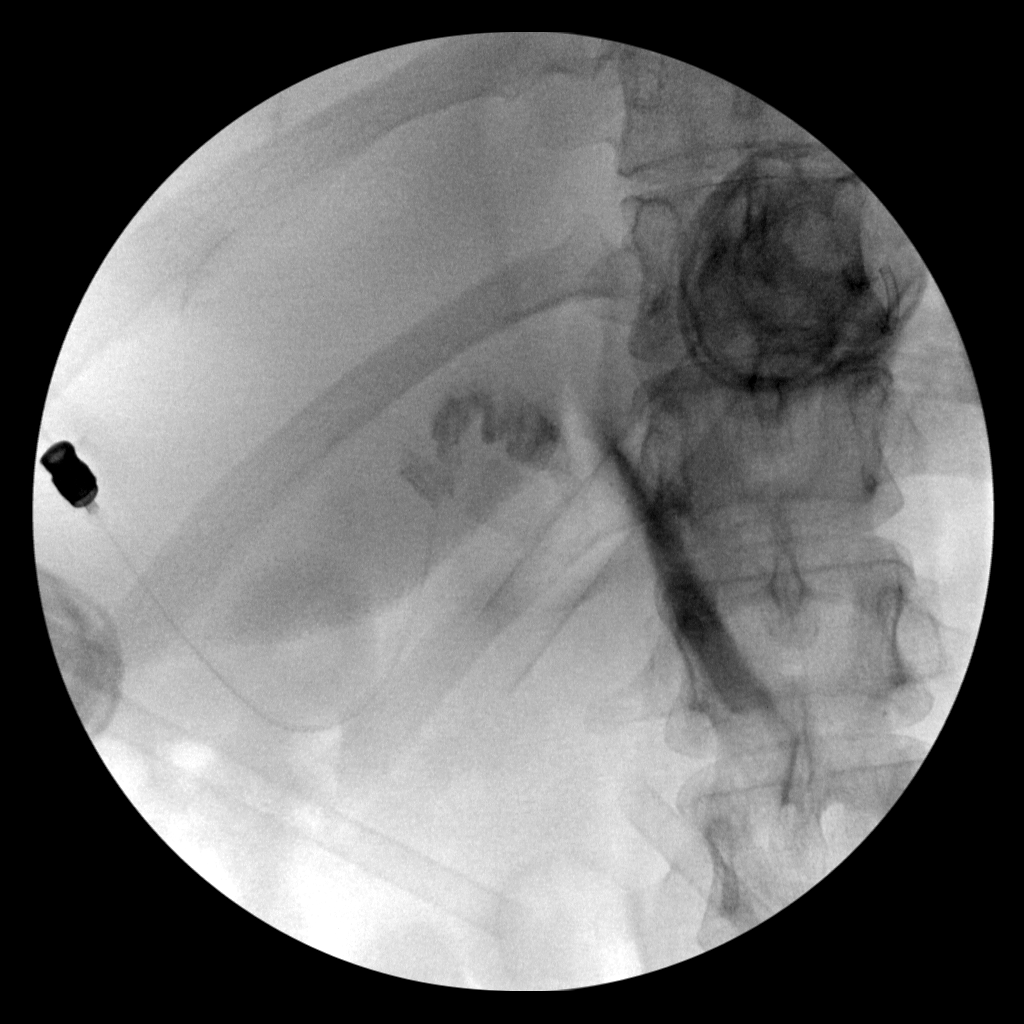
[frame 46/91]
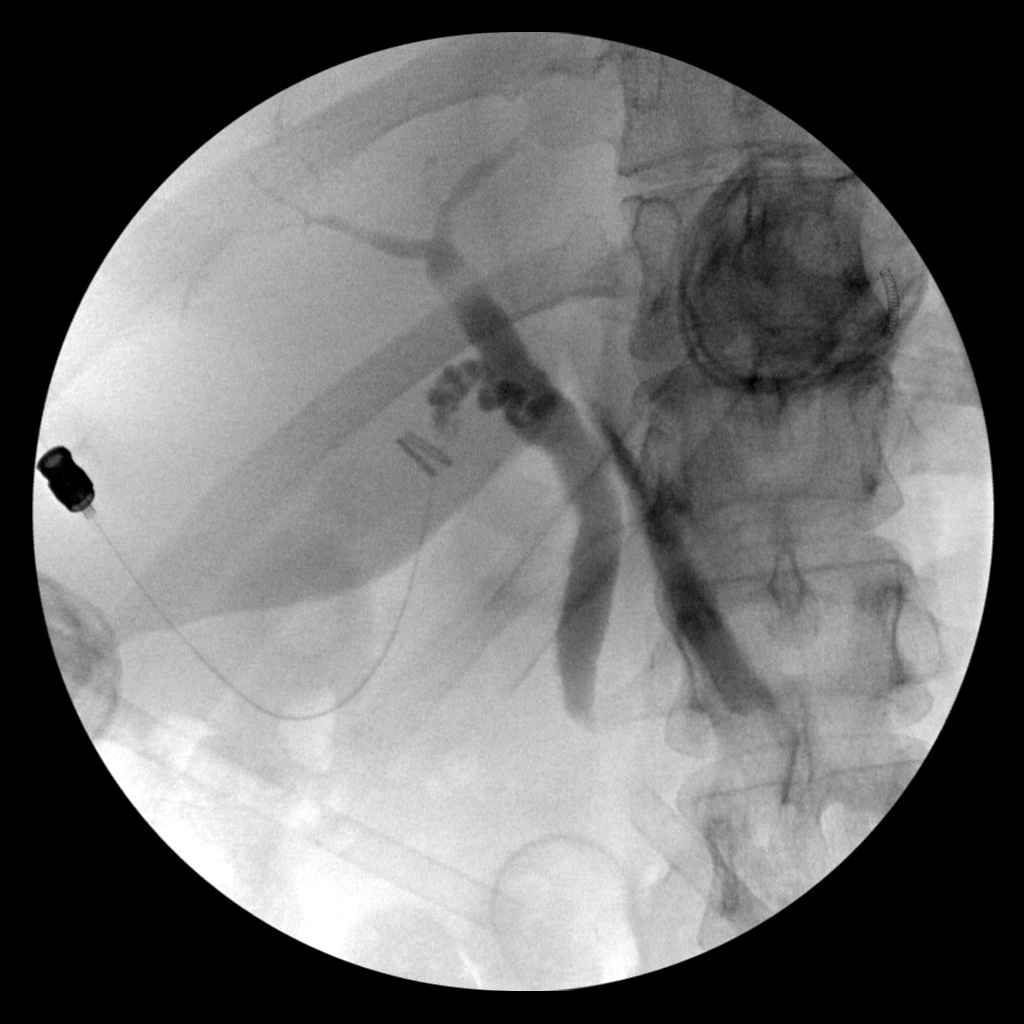
[frame 78/91]
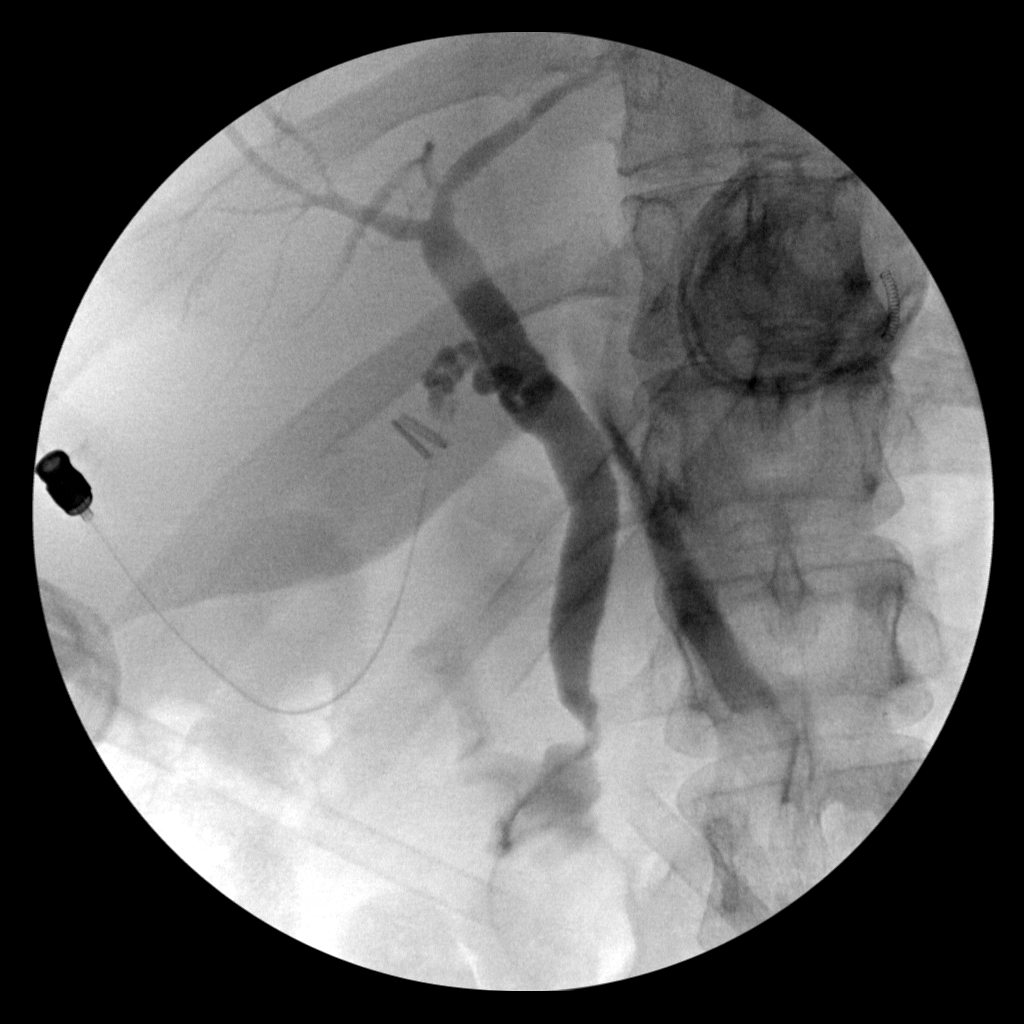

[3 of 3 positions shown; findings below may reference images not displayed]

FINDINGS: The gallbladder has been removed and the cystic duct cannulated.
Contrast injection demonstrates mildly dilated common bile duct, but
no obstructing stones or filling defects within the distal CBD.
There is passage of contrast into the proximal duodenum.
IMPRESSION: No evidence for CBD obstruction or retained common duct stones.

## 2016-09-25 DIAGNOSIS — Z136 Encounter for screening for cardiovascular disorders: Secondary | ICD-10-CM | POA: Diagnosis not present

## 2016-09-25 DIAGNOSIS — Z0000 Encounter for general adult medical examination without abnormal findings: Secondary | ICD-10-CM | POA: Diagnosis not present

## 2016-10-01 DIAGNOSIS — Z23 Encounter for immunization: Secondary | ICD-10-CM | POA: Diagnosis not present

## 2016-10-01 DIAGNOSIS — Z6828 Body mass index (BMI) 28.0-28.9, adult: Secondary | ICD-10-CM | POA: Diagnosis not present

## 2016-10-01 DIAGNOSIS — Z0000 Encounter for general adult medical examination without abnormal findings: Secondary | ICD-10-CM | POA: Diagnosis not present

## 2016-10-01 DIAGNOSIS — H6123 Impacted cerumen, bilateral: Secondary | ICD-10-CM | POA: Diagnosis not present

## 2016-10-01 DIAGNOSIS — L989 Disorder of the skin and subcutaneous tissue, unspecified: Secondary | ICD-10-CM | POA: Diagnosis not present

## 2016-10-01 DIAGNOSIS — L821 Other seborrheic keratosis: Secondary | ICD-10-CM | POA: Diagnosis not present

## 2016-10-06 DIAGNOSIS — J309 Allergic rhinitis, unspecified: Secondary | ICD-10-CM | POA: Diagnosis not present

## 2016-10-06 DIAGNOSIS — Z6828 Body mass index (BMI) 28.0-28.9, adult: Secondary | ICD-10-CM | POA: Diagnosis not present

## 2016-10-06 DIAGNOSIS — G479 Sleep disorder, unspecified: Secondary | ICD-10-CM | POA: Diagnosis not present

## 2016-10-06 DIAGNOSIS — B001 Herpesviral vesicular dermatitis: Secondary | ICD-10-CM | POA: Diagnosis not present

## 2016-12-01 DIAGNOSIS — B001 Herpesviral vesicular dermatitis: Secondary | ICD-10-CM | POA: Diagnosis not present

## 2016-12-01 DIAGNOSIS — G479 Sleep disorder, unspecified: Secondary | ICD-10-CM | POA: Diagnosis not present

## 2016-12-01 DIAGNOSIS — Z23 Encounter for immunization: Secondary | ICD-10-CM | POA: Diagnosis not present

## 2016-12-01 DIAGNOSIS — J309 Allergic rhinitis, unspecified: Secondary | ICD-10-CM | POA: Diagnosis not present

## 2016-12-01 DIAGNOSIS — Z6827 Body mass index (BMI) 27.0-27.9, adult: Secondary | ICD-10-CM | POA: Diagnosis not present

## 2016-12-05 DIAGNOSIS — D225 Melanocytic nevi of trunk: Secondary | ICD-10-CM | POA: Diagnosis not present

## 2016-12-05 DIAGNOSIS — N6082 Other benign mammary dysplasias of left breast: Secondary | ICD-10-CM | POA: Diagnosis not present

## 2016-12-05 DIAGNOSIS — L821 Other seborrheic keratosis: Secondary | ICD-10-CM | POA: Diagnosis not present

## 2016-12-05 DIAGNOSIS — L719 Rosacea, unspecified: Secondary | ICD-10-CM | POA: Diagnosis not present

## 2017-04-06 DIAGNOSIS — Z23 Encounter for immunization: Secondary | ICD-10-CM | POA: Diagnosis not present

## 2017-07-23 DIAGNOSIS — Z130 Encounter for screening for diseases of the blood and blood-forming organs and certain disorders involving the immune mechanism: Secondary | ICD-10-CM | POA: Diagnosis not present

## 2017-07-23 DIAGNOSIS — Z1389 Encounter for screening for other disorder: Secondary | ICD-10-CM | POA: Diagnosis not present

## 2017-07-23 DIAGNOSIS — Z6826 Body mass index (BMI) 26.0-26.9, adult: Secondary | ICD-10-CM | POA: Diagnosis not present

## 2017-07-23 DIAGNOSIS — Z1231 Encounter for screening mammogram for malignant neoplasm of breast: Secondary | ICD-10-CM | POA: Diagnosis not present

## 2017-07-23 DIAGNOSIS — Z01419 Encounter for gynecological examination (general) (routine) without abnormal findings: Secondary | ICD-10-CM | POA: Diagnosis not present

## 2017-10-05 DIAGNOSIS — Z1329 Encounter for screening for other suspected endocrine disorder: Secondary | ICD-10-CM | POA: Diagnosis not present

## 2017-10-05 DIAGNOSIS — Z13220 Encounter for screening for lipoid disorders: Secondary | ICD-10-CM | POA: Diagnosis not present

## 2017-10-05 DIAGNOSIS — Z114 Encounter for screening for human immunodeficiency virus [HIV]: Secondary | ICD-10-CM | POA: Diagnosis not present

## 2017-10-05 DIAGNOSIS — Z0000 Encounter for general adult medical examination without abnormal findings: Secondary | ICD-10-CM | POA: Diagnosis not present

## 2017-10-19 DIAGNOSIS — Z6827 Body mass index (BMI) 27.0-27.9, adult: Secondary | ICD-10-CM | POA: Diagnosis not present

## 2017-10-19 DIAGNOSIS — Z0000 Encounter for general adult medical examination without abnormal findings: Secondary | ICD-10-CM | POA: Diagnosis not present

## 2018-03-01 DIAGNOSIS — L309 Dermatitis, unspecified: Secondary | ICD-10-CM | POA: Diagnosis not present

## 2018-06-02 DIAGNOSIS — L821 Other seborrheic keratosis: Secondary | ICD-10-CM | POA: Diagnosis not present

## 2018-10-18 DIAGNOSIS — Z13220 Encounter for screening for lipoid disorders: Secondary | ICD-10-CM | POA: Diagnosis not present

## 2018-10-18 DIAGNOSIS — Z0000 Encounter for general adult medical examination without abnormal findings: Secondary | ICD-10-CM | POA: Diagnosis not present

## 2018-10-18 DIAGNOSIS — Z114 Encounter for screening for human immunodeficiency virus [HIV]: Secondary | ICD-10-CM | POA: Diagnosis not present

## 2018-10-18 DIAGNOSIS — Z1329 Encounter for screening for other suspected endocrine disorder: Secondary | ICD-10-CM | POA: Diagnosis not present

## 2018-10-20 DIAGNOSIS — B001 Herpesviral vesicular dermatitis: Secondary | ICD-10-CM | POA: Diagnosis not present

## 2018-10-20 DIAGNOSIS — Z6827 Body mass index (BMI) 27.0-27.9, adult: Secondary | ICD-10-CM | POA: Diagnosis not present

## 2018-10-20 DIAGNOSIS — Z0000 Encounter for general adult medical examination without abnormal findings: Secondary | ICD-10-CM | POA: Diagnosis not present

## 2018-10-25 DIAGNOSIS — L814 Other melanin hyperpigmentation: Secondary | ICD-10-CM | POA: Diagnosis not present

## 2018-10-25 DIAGNOSIS — D225 Melanocytic nevi of trunk: Secondary | ICD-10-CM | POA: Diagnosis not present

## 2018-10-25 DIAGNOSIS — L821 Other seborrheic keratosis: Secondary | ICD-10-CM | POA: Diagnosis not present

## 2018-12-13 DIAGNOSIS — R0981 Nasal congestion: Secondary | ICD-10-CM | POA: Diagnosis not present

## 2018-12-13 DIAGNOSIS — J111 Influenza due to unidentified influenza virus with other respiratory manifestations: Secondary | ICD-10-CM | POA: Diagnosis not present

## 2018-12-13 DIAGNOSIS — J069 Acute upper respiratory infection, unspecified: Secondary | ICD-10-CM | POA: Diagnosis not present

## 2018-12-13 DIAGNOSIS — J309 Allergic rhinitis, unspecified: Secondary | ICD-10-CM | POA: Diagnosis not present

## 2019-02-07 DIAGNOSIS — Z01419 Encounter for gynecological examination (general) (routine) without abnormal findings: Secondary | ICD-10-CM | POA: Diagnosis not present

## 2019-02-07 DIAGNOSIS — Z130 Encounter for screening for diseases of the blood and blood-forming organs and certain disorders involving the immune mechanism: Secondary | ICD-10-CM | POA: Diagnosis not present

## 2019-02-07 DIAGNOSIS — Z6826 Body mass index (BMI) 26.0-26.9, adult: Secondary | ICD-10-CM | POA: Diagnosis not present

## 2019-02-11 DIAGNOSIS — Z03818 Encounter for observation for suspected exposure to other biological agents ruled out: Secondary | ICD-10-CM | POA: Diagnosis not present

## 2019-12-07 DIAGNOSIS — L578 Other skin changes due to chronic exposure to nonionizing radiation: Secondary | ICD-10-CM | POA: Diagnosis not present

## 2019-12-07 DIAGNOSIS — D225 Melanocytic nevi of trunk: Secondary | ICD-10-CM | POA: Diagnosis not present

## 2019-12-07 DIAGNOSIS — L814 Other melanin hyperpigmentation: Secondary | ICD-10-CM | POA: Diagnosis not present

## 2019-12-07 DIAGNOSIS — L821 Other seborrheic keratosis: Secondary | ICD-10-CM | POA: Diagnosis not present

## 2020-06-25 DIAGNOSIS — Z130 Encounter for screening for diseases of the blood and blood-forming organs and certain disorders involving the immune mechanism: Secondary | ICD-10-CM | POA: Diagnosis not present

## 2020-06-25 DIAGNOSIS — Z6827 Body mass index (BMI) 27.0-27.9, adult: Secondary | ICD-10-CM | POA: Diagnosis not present

## 2020-06-25 DIAGNOSIS — Z3202 Encounter for pregnancy test, result negative: Secondary | ICD-10-CM | POA: Diagnosis not present

## 2020-06-25 DIAGNOSIS — N926 Irregular menstruation, unspecified: Secondary | ICD-10-CM | POA: Diagnosis not present

## 2020-06-25 DIAGNOSIS — N939 Abnormal uterine and vaginal bleeding, unspecified: Secondary | ICD-10-CM | POA: Diagnosis not present

## 2020-06-25 DIAGNOSIS — Z01419 Encounter for gynecological examination (general) (routine) without abnormal findings: Secondary | ICD-10-CM | POA: Diagnosis not present

## 2020-10-09 DIAGNOSIS — Z1152 Encounter for screening for COVID-19: Secondary | ICD-10-CM | POA: Diagnosis not present

## 2020-12-10 DIAGNOSIS — L821 Other seborrheic keratosis: Secondary | ICD-10-CM | POA: Diagnosis not present

## 2020-12-10 DIAGNOSIS — D1801 Hemangioma of skin and subcutaneous tissue: Secondary | ICD-10-CM | POA: Diagnosis not present

## 2020-12-10 DIAGNOSIS — L578 Other skin changes due to chronic exposure to nonionizing radiation: Secondary | ICD-10-CM | POA: Diagnosis not present

## 2021-04-09 DIAGNOSIS — Z0000 Encounter for general adult medical examination without abnormal findings: Secondary | ICD-10-CM | POA: Diagnosis not present

## 2021-04-11 DIAGNOSIS — Z0000 Encounter for general adult medical examination without abnormal findings: Secondary | ICD-10-CM | POA: Diagnosis not present

## 2021-04-11 DIAGNOSIS — H6121 Impacted cerumen, right ear: Secondary | ICD-10-CM | POA: Diagnosis not present

## 2021-04-11 DIAGNOSIS — Z23 Encounter for immunization: Secondary | ICD-10-CM | POA: Diagnosis not present

## 2021-04-11 DIAGNOSIS — H6122 Impacted cerumen, left ear: Secondary | ICD-10-CM | POA: Diagnosis not present

## 2021-04-11 DIAGNOSIS — H6123 Impacted cerumen, bilateral: Secondary | ICD-10-CM | POA: Diagnosis not present

## 2021-10-29 DIAGNOSIS — Z23 Encounter for immunization: Secondary | ICD-10-CM | POA: Diagnosis not present

## 2021-12-11 DIAGNOSIS — L578 Other skin changes due to chronic exposure to nonionizing radiation: Secondary | ICD-10-CM | POA: Diagnosis not present

## 2021-12-11 DIAGNOSIS — L309 Dermatitis, unspecified: Secondary | ICD-10-CM | POA: Diagnosis not present

## 2021-12-11 DIAGNOSIS — L821 Other seborrheic keratosis: Secondary | ICD-10-CM | POA: Diagnosis not present

## 2021-12-11 DIAGNOSIS — L814 Other melanin hyperpigmentation: Secondary | ICD-10-CM | POA: Diagnosis not present

## 2021-12-11 DIAGNOSIS — L820 Inflamed seborrheic keratosis: Secondary | ICD-10-CM | POA: Diagnosis not present

## 2022-02-26 DIAGNOSIS — J309 Allergic rhinitis, unspecified: Secondary | ICD-10-CM | POA: Diagnosis not present

## 2022-02-26 DIAGNOSIS — B001 Herpesviral vesicular dermatitis: Secondary | ICD-10-CM | POA: Diagnosis not present

## 2022-09-17 DIAGNOSIS — Z114 Encounter for screening for human immunodeficiency virus [HIV]: Secondary | ICD-10-CM | POA: Diagnosis not present

## 2022-09-17 DIAGNOSIS — Z0000 Encounter for general adult medical examination without abnormal findings: Secondary | ICD-10-CM | POA: Diagnosis not present

## 2022-09-17 DIAGNOSIS — Z13220 Encounter for screening for lipoid disorders: Secondary | ICD-10-CM | POA: Diagnosis not present

## 2022-09-26 ENCOUNTER — Other Ambulatory Visit: Payer: Self-pay | Admitting: Family Medicine

## 2022-09-26 DIAGNOSIS — Z0000 Encounter for general adult medical examination without abnormal findings: Secondary | ICD-10-CM | POA: Diagnosis not present

## 2022-09-26 DIAGNOSIS — Z1231 Encounter for screening mammogram for malignant neoplasm of breast: Secondary | ICD-10-CM

## 2022-11-21 ENCOUNTER — Ambulatory Visit
Admission: RE | Admit: 2022-11-21 | Discharge: 2022-11-21 | Disposition: A | Discharge status: Home / Self Care | Payer: BC Managed Care – PPO | Source: Ambulatory Visit | Attending: Family Medicine | Admitting: Family Medicine

## 2022-11-21 DIAGNOSIS — Z1231 Encounter for screening mammogram for malignant neoplasm of breast: Secondary | ICD-10-CM

## 2023-01-07 DIAGNOSIS — L814 Other melanin hyperpigmentation: Secondary | ICD-10-CM | POA: Diagnosis not present

## 2023-01-07 DIAGNOSIS — L578 Other skin changes due to chronic exposure to nonionizing radiation: Secondary | ICD-10-CM | POA: Diagnosis not present

## 2023-01-07 DIAGNOSIS — D225 Melanocytic nevi of trunk: Secondary | ICD-10-CM | POA: Diagnosis not present

## 2023-01-07 DIAGNOSIS — L821 Other seborrheic keratosis: Secondary | ICD-10-CM | POA: Diagnosis not present

## 2023-06-12 ENCOUNTER — Emergency Department (HOSPITAL_COMMUNITY): Payer: BC Managed Care – PPO

## 2023-06-12 ENCOUNTER — Emergency Department (HOSPITAL_COMMUNITY)
Admission: EM | Admit: 2023-06-12 | Discharge: 2023-06-13 | Disposition: A | Discharge status: Home / Self Care | Payer: BC Managed Care – PPO | Attending: Emergency Medicine | Admitting: Emergency Medicine

## 2023-06-12 ENCOUNTER — Encounter (HOSPITAL_COMMUNITY): Payer: Self-pay

## 2023-06-12 ENCOUNTER — Other Ambulatory Visit: Payer: Self-pay

## 2023-06-12 DIAGNOSIS — D72829 Elevated white blood cell count, unspecified: Secondary | ICD-10-CM | POA: Diagnosis not present

## 2023-06-12 DIAGNOSIS — K5641 Fecal impaction: Secondary | ICD-10-CM | POA: Insufficient documentation

## 2023-06-12 DIAGNOSIS — R1030 Lower abdominal pain, unspecified: Secondary | ICD-10-CM | POA: Diagnosis not present

## 2023-06-12 DIAGNOSIS — R739 Hyperglycemia, unspecified: Secondary | ICD-10-CM | POA: Insufficient documentation

## 2023-06-12 LAB — COMPREHENSIVE METABOLIC PANEL
ALT: 17 U/L (ref 0–44)
AST: 23 U/L (ref 15–41)
Albumin: 4.8 g/dL (ref 3.5–5.0)
Alkaline Phosphatase: 39 U/L (ref 38–126)
Anion gap: 11 (ref 5–15)
BUN: 18 mg/dL (ref 6–20)
CO2: 20 mmol/L — ABNORMAL LOW (ref 22–32)
Calcium: 9.7 mg/dL (ref 8.9–10.3)
Chloride: 104 mmol/L (ref 98–111)
Creatinine, Ser: 0.74 mg/dL (ref 0.44–1.00)
GFR, Estimated: >60 mL/min (ref >60)
Glucose, Bld: 113 mg/dL — ABNORMAL HIGH (ref 70–99)
Potassium: 3.6 mmol/L (ref 3.5–5.1)
Sodium: 135 mmol/L (ref 135–145)
Total Bilirubin: 0.9 mg/dL (ref 0.3–1.2)
Total Protein: 8.1 g/dL (ref 6.5–8.1)

## 2023-06-12 LAB — CBC WITH DIFFERENTIAL/PLATELET
Abs Immature Granulocytes: 0.05 10*3/uL (ref 0.00–0.07)
Basophils Absolute: 0 10*3/uL (ref 0.0–0.1)
Basophils Relative: 0 %
Eosinophils Absolute: 0 10*3/uL (ref 0.0–0.5)
Eosinophils Relative: 0 %
HCT: 39.4 % (ref 36.0–46.0)
Hemoglobin: 13.6 g/dL (ref 12.0–15.0)
Immature Granulocytes: 0 %
Lymphocytes Relative: 9 %
Lymphs Abs: 1.3 10*3/uL (ref 0.7–4.0)
MCH: 29.9 pg (ref 26.0–34.0)
MCHC: 34.5 g/dL (ref 30.0–36.0)
MCV: 86.6 fL (ref 80.0–100.0)
Monocytes Absolute: 0.4 10*3/uL (ref 0.1–1.0)
Monocytes Relative: 3 %
Neutro Abs: 12.9 10*3/uL — ABNORMAL HIGH (ref 1.7–7.7)
Neutrophils Relative %: 88 %
Platelets: 255 10*3/uL (ref 150–400)
RBC: 4.55 MIL/uL (ref 3.87–5.11)
RDW: 12.8 % (ref 11.5–15.5)
WBC: 14.7 10*3/uL — ABNORMAL HIGH (ref 4.0–10.5)
nRBC: 0 % (ref 0.0–0.2)

## 2023-06-12 LAB — HCG, SERUM, QUALITATIVE: Preg, Serum: NEGATIVE

## 2023-06-12 LAB — URINALYSIS, ROUTINE W REFLEX MICROSCOPIC
Bilirubin Urine: NEGATIVE
Glucose, UA: NEGATIVE mg/dL
Hgb urine dipstick: NEGATIVE
Ketones, ur: 20 mg/dL — AB
Leukocytes,Ua: NEGATIVE
Nitrite: NEGATIVE
Protein, ur: NEGATIVE mg/dL
Specific Gravity, Urine: 1.014 (ref 1.005–1.030)
pH: 5 (ref 5.0–8.0)

## 2023-06-12 LAB — LIPASE, BLOOD: Lipase: 37 U/L (ref 11–51)

## 2023-06-12 MED ORDER — IOHEXOL 300 MG/ML  SOLN
100.0000 mL | Freq: Once | INTRAMUSCULAR | Status: AC | PRN
  Administered 2023-06-12: 100 mL via INTRAVENOUS

## 2023-06-12 NOTE — ED Provider Triage Note (Signed)
Emergency Medicine Provider Triage Evaluation Note  RHAE AUSLANDER , a 42 y.o. female  was evaluated in triage.  Pt complains of abdominal pain that started abruptly today.  Patient states she went to have a bowel movement was unable to pass any stool.  Had a normal bowel movement yesterday.  No history of chronic constipation.  Denies vomiting.  Previous cholecystectomy.  Review of Systems  Positive: constipation Negative: vomiting  Physical Exam  BP (!) 155/107   Pulse 83   Temp 98.4 F (36.9 C) (Oral)   Resp 18   Ht 5\' 5"  (1.651 m)   Wt 70.3 kg   SpO2 99%   BMI 25.79 kg/m  Gen:   Awake, no distress   Resp:  Normal effort  MSK:   Moves extremities without difficulty  Other:    Medical Decision Making  Medically screening exam initiated at 7:43 PM.  Appropriate orders placed.  KIMORA BOLDUC was informed that the remainder of the evaluation will be completed by another provider, this initial triage assessment does not replace that evaluation, and the importance of remaining in the ED until their evaluation is complete.  Labs CT abd   Mannie Stabile, [place] 06/12/23 1944

## 2023-06-12 NOTE — ED Triage Notes (Signed)
Lower abdominal pain started at 1600. Pt states she went to have BM and was unable to get it out. Pt states she is leaking stool now and feels impacted. Slight nausea, no vomiting.

## 2023-06-13 MED ORDER — MINERAL OIL RE ENEM
1.0000 | ENEMA | Freq: Once | RECTAL | Status: AC
  Administered 2023-06-13: 1 via RECTAL
  Filled 2023-06-13: qty 1

## 2023-06-13 NOTE — Discharge Instructions (Signed)
You have been seen today for your complaint of constipation, fecal impaction. Your lab work was reassuring. Your imaging showed a fecal impaction. Your discharge medications include MiraLAX.  This is a laxative.  Take 1 capful every 4 hours until you have a successful bowel movement. Home care instructions are as follows:  Increase your water and fiber intake.  You may take a stool softener such as Colace daily until you return to your normal bowel regimen Follow up with: Your primary care provider in 1 week for reevaluation Please seek immediate medical care if you develop any of the following symptoms: You have black or tarry stools. At this time there does not appear to be the presence of an emergent medical condition, however there is always the potential for conditions to change. Please read and follow the below instructions.  Do not take your medicine if  develop an itchy rash, swelling in your mouth or lips, or difficulty breathing; call 911 and seek immediate emergency medical attention if this occurs.  You may review your lab tests and imaging results in their entirety on your MyChart account.  Please discuss all results of fully with your primary care provider and other specialist at your follow-up visit.  Note: Portions of this text may have been transcribed using voice recognition software. Every effort was made to ensure accuracy; however, inadvertent computerized transcription errors may still be present.

## 2023-06-13 NOTE — ED Provider Notes (Signed)
[place] EMERGENCY DEPARTMENT AT [facility] Provider Note   CSN: [id number] Arrival date & time: 06/12/23  1922     History  Chief Complaint  Patient presents with   Abdominal Pain    Tricia Skinner is a 42 y.o. female.  Presenting for evaluation of lower abdominal pain.  She states she attempted to have a bowel movement earlier today but was unable to.  This is not normal for her.  Her last bowel movement was 2 to 3 days ago.  Typically does not struggle with constipation.  She has a constant urge to defecate but has not been able to which is causing some rectal pain.  No fevers or chills.  She states she is now leaking stool.  Denies blood in the stool.   Abdominal Pain Associated symptoms: constipation        Home Medications Prior to Admission medications   Medication Sig Start Date End Date Taking? Authorizing Provider  HYDROcodone-acetaminophen (NORCO/VICODIN) 5-325 MG per tablet Take 1 tablet by mouth every 6 (six) hours as needed for moderate pain. Patient not taking: Reported on 06/13/2023 06/16/15   Kinsinger, De Blanch, MD  ibuprofen (ADVIL,MOTRIN) 600 MG tablet Take 1 tablet (600 mg total) by mouth every 6 (six) hours. Patient not taking: Reported on 06/13/2023 05/19/15   Huel Cote, MD      Allergies    Patient has no known allergies.    Review of Systems   Review of Systems  Gastrointestinal:  Positive for abdominal pain and constipation.  All other systems reviewed and are negative.   Physical Exam Updated Vital Signs BP 115/77   Pulse 80   Temp 98.1 F (36.7 C)   Resp 16   Ht 5\' 5"  (1.651 m)   Wt 70.3 kg   SpO2 94%   BMI 25.79 kg/m  Physical Exam Vitals and nursing note reviewed.  Constitutional:      Appearance: Normal appearance. She is normal weight. She is not ill-appearing.     Comments: Appears uncomfortable  HENT:     Head: Normocephalic and atraumatic.  Pulmonary:     Effort: Pulmonary effort is normal. No  respiratory distress.  Abdominal:     General: Abdomen is flat.     Tenderness: There is generalized abdominal tenderness.  Musculoskeletal:        General: Normal range of motion.     Cervical back: Neck supple.  Skin:    General: Skin is warm and dry.  Neurological:     Mental Status: She is alert and oriented to person, place, and time.  Psychiatric:        Mood and Affect: Mood normal.        Behavior: Behavior normal.     ED Results / Procedures / Treatments   Labs (all labs ordered are listed, but only abnormal results are displayed) Labs Reviewed  CBC WITH DIFFERENTIAL/PLATELET - Abnormal; Notable for the following components:      Result Value   WBC 14.7 (*)    Neutro Abs 12.9 (*)    All other components within normal limits  COMPREHENSIVE METABOLIC PANEL - Abnormal; Notable for the following components:   CO2 20 (*)    Glucose, Bld 113 (*)    All other components within normal limits  URINALYSIS, ROUTINE W REFLEX MICROSCOPIC - Abnormal; Notable for the following components:   Ketones, ur 20 (*)    All other components within normal limits  LIPASE, BLOOD  HCG, SERUM, QUALITATIVE    EKG None  Radiology CT ABDOMEN PELVIS W CONTRAST  Result Date: 06/12/2023 CLINICAL DATA:  Lower abdominal pain. Bowel obstruction suspected. Wakening stool now feels impacted. Slight nausea. No vomiting. EXAM: CT ABDOMEN AND PELVIS WITH CONTRAST TECHNIQUE: Multidetector CT imaging of the abdomen and pelvis was performed using the standard protocol following bolus administration of intravenous contrast. RADIATION DOSE REDUCTION: This exam was performed according to the departmental dose-optimization program which includes automated exposure control, adjustment of the mA and/or kV according to patient size and/or use of iterative reconstruction technique. CONTRAST:  OMNIPAQUE IOHEXOL 300 MG/ML  SOLN COMPARISON:  Abdominal ultrasound 06/12/2015 FINDINGS: Lower chest: No acute  abnormality. Hepatobiliary: Cholecystectomy. Unremarkable liver and biliary tree. Pancreas: Unremarkable. Spleen: Unremarkable. Adrenals/Urinary Tract: Normal adrenal glands. No urinary calculi or hydronephrosis. Unremarkable bladder. Stomach/Bowel: Moderate to large dense stool in the distal sigmoid colon and rectum. No bowel wall thickening. No evidence of small-bowel obstruction. Stomach is within normal limits. Normal appendix. Vascular/Lymphatic: No significant vascular findings are present. No enlarged abdominal or pelvic lymph nodes. Reproductive: Irregular hyperattenuating area in the anterior uterus is likely a fibroid. The ovaries are unremarkable. Other: No free intraperitoneal fluid or air. Musculoskeletal: No acute fracture. IMPRESSION: Findings suggestive of fecal impaction in the sigmoid colon and rectum. Electronically Signed   By: Minerva Fester M.D.   On: 06/12/2023 22:21    Procedures Procedures    Medications Ordered in ED Medications  iohexol (OMNIPAQUE) 300 MG/ML solution 100 mL (100 mLs Intravenous Contrast Given 06/12/23 2103)  mineral oil enema 1 enema (1 enema Rectal Given 06/13/23 0435)    ED Course/ Medical Decision Making/ A&P                                 Medical Decision Making Risk OTC drugs.   This patient presents to the ED for concern of abdominal pain, fecal impaction, this involves an extensive number of treatment options, and is a complaint that carries with it a high risk of complications and morbidity.  The differential diagnosis includes slow transit constipation, proctocolitis, fecal impaction  My initial workup includes labs, imaging  Additional history obtained from: Nursing notes from this visit. Family husband at bedside provides a portion of the history  I ordered, reviewed and interpreted labs which include: CMP, CBC, lipase, urinalysis, hCG.  Leukocytosis of 14.7.  Hyperglycemia of 113.  Bicarb of 20.  I ordered imaging studies  including CT abdomen pelvis I independently visualized and interpreted imaging which showed fecal impaction I agree with the radiologist interpretation  Afebrile, hemodynamically stable.  42 year old female presents to the ED for evaluation of a cough reaction.  Does not typically struggle with constipation.  She took a laxative and Dulcolax suppository prior to arrival without improvement.  She has lower abdominal discomfort and rectal pain.  I discussed treatment options with the patient regarding manual disimpaction versus home disimpaction.  She opted for disimpaction here in the emergency department. Performed disimpaction with improvement in symptoms and small amount of stool. She then preferred to discharge home and trial miralax and enemas. Believe this is reasonable. She was educated on appropriate use of these medications. She was given return precautions. Stable at discharge.   At this time there does not appear to be any evidence of an acute emergency medical condition and the patient appears stable for discharge with appropriate outpatient follow up. Diagnosis was  discussed with patient who verbalizes understanding of care plan and is agreeable to discharge. I have discussed return precautions with patient who verbalizes understanding. Patient encouraged to follow-up with their PCP within 1 week. All questions answered.  Note: Portions of this report may have been transcribed using voice recognition software. Every effort was made to ensure accuracy; however, inadvertent computerized transcription errors may still be present.        Final Clinical Impression(s) / ED Diagnoses Final diagnoses:  Fecal impaction [facility])    Rx / DC Orders ED Discharge Orders     None         Mora Bellman 06/13/23 1610    Nira Conn, MD 06/13/23 [phone]

## 2023-07-14 ENCOUNTER — Telehealth: Payer: Self-pay

## 2023-07-14 NOTE — Telephone Encounter (Signed)
Transition Care Management Unsuccessful Follow-up Telephone Call  Date of discharge and from where:  [facility] 9/14  Attempts:  1st Attempt  Reason for unsuccessful TCM follow-up call:  No answer/busy   Tricia Skinner [place]  [facility], [facility] Guide, Phone: [phone] Website: [email or web address]

## 2023-07-15 ENCOUNTER — Telehealth: Payer: Self-pay

## 2023-07-15 NOTE — Telephone Encounter (Signed)
Transition Care Management Unsuccessful Follow-up Telephone Call  Date of discharge and from where:  Tricia Skinner 9/14  Attempts:  2nd Attempt  Reason for unsuccessful TCM follow-up call:  No answer/busy   [name]  [facility], [facility] Guide, Phone: [phone] Website: [email or web address]

## 2023-09-28 DIAGNOSIS — Z114 Encounter for screening for human immunodeficiency virus [HIV]: Secondary | ICD-10-CM | POA: Diagnosis not present

## 2023-09-28 DIAGNOSIS — Z13220 Encounter for screening for lipoid disorders: Secondary | ICD-10-CM | POA: Diagnosis not present

## 2023-09-28 DIAGNOSIS — Z0000 Encounter for general adult medical examination without abnormal findings: Secondary | ICD-10-CM | POA: Diagnosis not present

## 2023-10-13 ENCOUNTER — Other Ambulatory Visit: Payer: Self-pay | Admitting: Physician Assistant

## 2023-10-13 DIAGNOSIS — Z0000 Encounter for general adult medical examination without abnormal findings: Secondary | ICD-10-CM | POA: Diagnosis not present

## 2023-10-13 DIAGNOSIS — Z1231 Encounter for screening mammogram for malignant neoplasm of breast: Secondary | ICD-10-CM

## 2023-11-24 ENCOUNTER — Ambulatory Visit
Admission: RE | Admit: 2023-11-24 | Discharge: 2023-11-24 | Disposition: A | Discharge status: Home / Self Care | Payer: BC Managed Care – PPO | Source: Ambulatory Visit | Attending: Physician Assistant | Admitting: Physician Assistant

## 2023-11-24 DIAGNOSIS — Z1231 Encounter for screening mammogram for malignant neoplasm of breast: Secondary | ICD-10-CM

## 2024-01-26 DIAGNOSIS — L814 Other melanin hyperpigmentation: Secondary | ICD-10-CM | POA: Diagnosis not present

## 2024-01-26 DIAGNOSIS — L821 Other seborrheic keratosis: Secondary | ICD-10-CM | POA: Diagnosis not present

## 2024-01-26 DIAGNOSIS — D225 Melanocytic nevi of trunk: Secondary | ICD-10-CM | POA: Diagnosis not present

## 2024-01-26 DIAGNOSIS — L578 Other skin changes due to chronic exposure to nonionizing radiation: Secondary | ICD-10-CM | POA: Diagnosis not present
